# Patient Record
Sex: Female | Born: 1960 | Race: White | Hispanic: No | Marital: Married | Smoking: Never smoker
Health system: Southern US, Community
[De-identification: ages and names within clinical notes are randomized; demographics above are authoritative.]

## PROBLEM LIST (undated history)

## (undated) DIAGNOSIS — O24419 Gestational diabetes mellitus in pregnancy, unspecified control: Secondary | ICD-10-CM

## (undated) DIAGNOSIS — K219 Gastro-esophageal reflux disease without esophagitis: Secondary | ICD-10-CM

## (undated) DIAGNOSIS — B019 Varicella without complication: Secondary | ICD-10-CM

## (undated) DIAGNOSIS — Z87440 Personal history of urinary (tract) infections: Secondary | ICD-10-CM

## (undated) DIAGNOSIS — T7840XA Allergy, unspecified, initial encounter: Secondary | ICD-10-CM

## (undated) DIAGNOSIS — E039 Hypothyroidism, unspecified: Secondary | ICD-10-CM

## (undated) HISTORY — DX: Personal history of urinary (tract) infections: Z87.440

## (undated) HISTORY — DX: Gestational diabetes mellitus in pregnancy, unspecified control: O24.419

## (undated) HISTORY — DX: Allergy, unspecified, initial encounter: T78.40XA

## (undated) HISTORY — DX: Varicella without complication: B01.9

## (undated) HISTORY — DX: Gastro-esophageal reflux disease without esophagitis: K21.9

## (undated) HISTORY — DX: Hypothyroidism, unspecified: E03.9

## (undated) HISTORY — PX: WISDOM TOOTH EXTRACTION: SHX21

---

## 1996-05-16 HISTORY — PX: EXCISION VAGINAL CYST: SHX5825

## 1997-05-16 DIAGNOSIS — N200 Calculus of kidney: Secondary | ICD-10-CM

## 1997-05-16 HISTORY — PX: LITHOTRIPSY: SUR834

## 1997-05-16 HISTORY — DX: Calculus of kidney: N20.0

## 2005-04-25 ENCOUNTER — Other Ambulatory Visit: Admission: RE | Admit: 2005-04-25 | Discharge: 2005-04-25 | Payer: Self-pay | Admitting: Family Medicine

## 2006-08-16 ENCOUNTER — Other Ambulatory Visit: Admission: RE | Admit: 2006-08-16 | Discharge: 2006-08-16 | Payer: Self-pay | Admitting: Family Medicine

## 2009-09-02 ENCOUNTER — Encounter: Admission: RE | Admit: 2009-09-02 | Discharge: 2009-09-02 | Payer: Self-pay | Admitting: Family Medicine

## 2013-05-10 ENCOUNTER — Other Ambulatory Visit: Payer: Self-pay | Admitting: Family Medicine

## 2013-05-10 DIAGNOSIS — Z1231 Encounter for screening mammogram for malignant neoplasm of breast: Secondary | ICD-10-CM

## 2013-05-31 ENCOUNTER — Ambulatory Visit: Payer: Self-pay

## 2013-06-10 ENCOUNTER — Ambulatory Visit
Admission: RE | Admit: 2013-06-10 | Discharge: 2013-06-10 | Disposition: A | Discharge status: Home / Self Care | Payer: BC Managed Care – PPO | Source: Ambulatory Visit | Attending: Family Medicine | Admitting: Family Medicine

## 2013-06-10 DIAGNOSIS — Z1231 Encounter for screening mammogram for malignant neoplasm of breast: Secondary | ICD-10-CM

## 2016-03-07 LAB — FECAL OCCULT BLOOD, GUAIAC: Fecal Occult Blood: NEGATIVE

## 2017-08-01 ENCOUNTER — Other Ambulatory Visit: Payer: Self-pay | Admitting: Family Medicine

## 2017-08-01 DIAGNOSIS — Z139 Encounter for screening, unspecified: Secondary | ICD-10-CM

## 2017-09-04 ENCOUNTER — Encounter: Payer: Self-pay | Admitting: Radiology

## 2017-09-04 ENCOUNTER — Ambulatory Visit
Admission: RE | Admit: 2017-09-04 | Discharge: 2017-09-04 | Disposition: A | Discharge status: Home / Self Care | Payer: BC Managed Care – PPO | Source: Ambulatory Visit | Attending: Family Medicine | Admitting: Family Medicine

## 2017-09-04 DIAGNOSIS — Z139 Encounter for screening, unspecified: Secondary | ICD-10-CM

## 2017-09-04 LAB — HM MAMMOGRAPHY

## 2017-09-06 LAB — LIPID PANEL
Cholesterol: 197 (ref 0–200)
HDL: 49 (ref 35–70)
LDL Cholesterol: 127
Triglycerides: 108 (ref 40–160)

## 2017-09-06 LAB — BASIC METABOLIC PANEL
BUN: 10 (ref 4–21)
Creatinine: 0.7 (ref 0.5–1.1)
Potassium: 3.9 (ref 3.4–5.3)
Sodium: 140 (ref 137–147)

## 2017-09-06 LAB — TSH: TSH: 2.25 (ref 0.41–5.90)

## 2017-09-06 LAB — HM PAP SMEAR

## 2017-11-27 LAB — HM COLONOSCOPY

## 2018-12-20 ENCOUNTER — Other Ambulatory Visit: Payer: Self-pay

## 2018-12-20 DIAGNOSIS — Z20822 Contact with and (suspected) exposure to covid-19: Secondary | ICD-10-CM

## 2018-12-22 LAB — NOVEL CORONAVIRUS, NAA: SARS-CoV-2, NAA: NOT DETECTED

## 2019-03-18 ENCOUNTER — Other Ambulatory Visit: Payer: Self-pay

## 2019-03-18 DIAGNOSIS — Z20822 Contact with and (suspected) exposure to covid-19: Secondary | ICD-10-CM

## 2019-03-19 LAB — NOVEL CORONAVIRUS, NAA: SARS-CoV-2, NAA: NOT DETECTED

## 2019-06-03 ENCOUNTER — Ambulatory Visit (INDEPENDENT_AMBULATORY_CARE_PROVIDER_SITE_OTHER): Payer: BC Managed Care – PPO | Admitting: Family Medicine

## 2019-06-03 ENCOUNTER — Encounter: Payer: Self-pay | Admitting: Family Medicine

## 2019-06-03 ENCOUNTER — Other Ambulatory Visit: Payer: Self-pay

## 2019-06-03 VITALS — BP 142/82 | HR 72 | Temp 98.0°F | Resp 16 | Ht 61.0 in | Wt 188.2 lb

## 2019-06-03 DIAGNOSIS — R030 Elevated blood-pressure reading, without diagnosis of hypertension: Secondary | ICD-10-CM

## 2019-06-03 DIAGNOSIS — E039 Hypothyroidism, unspecified: Secondary | ICD-10-CM

## 2019-06-03 DIAGNOSIS — E669 Obesity, unspecified: Secondary | ICD-10-CM | POA: Diagnosis not present

## 2019-06-03 DIAGNOSIS — Z0000 Encounter for general adult medical examination without abnormal findings: Secondary | ICD-10-CM

## 2019-06-03 DIAGNOSIS — Z780 Asymptomatic menopausal state: Secondary | ICD-10-CM | POA: Insufficient documentation

## 2019-06-03 NOTE — Patient Instructions (Addendum)
Great to meet you today! We will check your thyroid levels today and call you with results.  Watch your blood pressure.  Low sodium diet and > 150 minutes per week exercise.  As soon as we get your records we will get you scheduled for physical and get the rest of your labs.     COVID vaccines are starting for the community.  1. Health Department: Appointments are required .Once your age bracket is called (watch news and visit website below for info) appt can be made by calling [phone] and selecting option 2.  Walk-ins will not be accepted.                  Clinic locations are: [facility], [address] [place]; [facility] [address], [address] [place]; . [facility] at [facility], [address], [address], [place]. Visit [email or web address] and click on the "COVID-19 Vaccine Info" rectangle for more information about vaccinations.  2. [place] web page will also have up to date info on vaccinations offered through [facility] System at  BornMarketers.[email or web address]. - Appt also required.  - vaccines are administered at the [employer] ([facility] women[facility] hosp). . If website indicates you are eligible for vaccine- please call [phone]   3. [facility]  -This will be done according to the rollout plan published by the [facility] of Health and [employer] (NCDHHS). The first public phase, also known as "Phase 1(b)", is for individuals 75 and older. Location: [facility] (Health and Chief Strategy Officer) [address]; [place] [address] -  All citizens receiving a vaccine must complete a COVID-19 consent form. To lessen your wait time, these can be found and completed ahead of time at www.[email or web address] or you may pick up a paper copy at the [facility] [phone] in [place]) or at [employer] - Future dates and locations will be widely shared through the [facility] Division of Public Health's COVID-19 Hotline at [phone], media outlets in the [place] and through the [employer] ([email or web address] and [email or web address]). Citizens can also contact the Department of Health and Human Services at [phone] for more information   Participants are asked to wear a face covering at vaccination sites. We are committed to keeping you informed about the COVID-19 vaccine.  As the vaccine continues to become available for each phase, we will ensure that patients who meet the criteria receive the information they need to access vaccination opportunities. Continue to check your MyChart account and [email or web address] for updates. Please review the Phase 1b information below.  Following [place]'s guidelines for the distribution of COVID-19 vaccines we are pleased to share our plans to begin offering vaccines to those 65 and older (Phase 1b). Here are details of those plans:  [facility] COVID-19 Vaccination Clinic - [place] On Tuesday, Jan. 19, the [facility] Division of Public Health [facility]) and [place] begin large-scale COVID-19 vaccinations at the [facility]. The vaccinations are appointment only and for those 65 and older.  Walk-ins will not be accepted.  All appointments are currently filled. Please join our waiting list for the next available appointments. We will contact you when appointments become available. Please do not sign up more than once.  Join Our Waiting List   Other Vaccination Opportunities in Our Area We are also working in  partnership with county health agencies in our service counties to ensure continuing vaccination availability in the weeks and months ahead. Learn more about each county's vaccination  efforts in the website links below:   [place]'s phase 1b vaccination guidelines, prioritizing those 65 and over as the next eligible group to receive the COVID-19 vaccine, are detailed at MobCommunity.ch.   Vaccine Safety and Effectiveness Clinical trials for the Pfizer COVID-19 vaccine involved 42,000 people and showed that the vaccine is more than 95% effective in preventing COVID-19 with no serious safety concerns. Similar results have been reported for the Moderna COVID-19 vaccine. Side effects reported in the [place] clinical trials include a sore arm at the injection site, fatigue, headache, chills and fever. While side effects from the Gardiner COVID-19 vaccine are higher than for a typical flu vaccine, they are lower in many ways than side effects from the leading vaccine to prevent shingles. Side effects are signs that a vaccine is working and are related to your immune system being stimulated to produce antibodies against infection. Side effects from vaccination are far less significant than health impacts from COVID-19.  Staying Informed Pharmacists, infectious disease doctors, critical care nurses and other experts at [facility] continue to speak publicly through media interviews and direct communication with our patients and communities about the safety, effectiveness and importance of vaccines to eliminate COVID-19. In addition, reliable information on vaccine safety, effectiveness, side effects and more is available on the following websites:  [place] Department of Health and Human Services COVID-19 Vaccine Information Website.  U.S. Centers for Disease Control and Prevention CVELF-81 Human resources officer.  Staying Safe We agree with the CDC on what we can do to help our communities get back to normal: Getting "back to normal" is going to take all of our tools. If we use all the tools we have, we  stand the best chance of getting our families, communities, schools and workplaces "back to normal" sooner:  Get vaccinated as soon as vaccines become available within the phase of the state's vaccination rollout plan for which you meet the eligibility criteria.  Wear a mask.  Stay 6 feet from others and avoid crowds.  Wash hands often.  For our most current information, please visit [email or web address].  Please help [place] help you:  We are honored you have chosen [place] for your Primary Care home. Below you will find basic instructions that you may need to access in the future. Please help [place] help you by reading the instructions, which cover many of the frequent questions we experience.   Prescription refills and request:  -In order to allow more efficient response time, please call your pharmacy for all refills. They will forward the request electronically to [place]. This allows for the quickest possible response. Request left on a nurse line can take longer to refill, since these are checked as time allows between office patients and other phone calls.  - refill request can take up to 3-5 working days to complete.  - If request is sent electronically and request is appropiate, it is usually completed in 1-2 business days.  - all patients will need to be seen routinely for all chronic medical conditions requiring prescription medications (see follow-up below). If you are overdue for follow up on your condition, you will be asked to make an appointment and we will call in enough medication to cover you until your appointment (up  to 30 days).  - all controlled substances will require a face to face visit to request/refill.  - if you desire your prescriptions to go through a new pharmacy, and have an active script at original pharmacy, you will need to call your pharmacy and have scripts transferred to new pharmacy. This is completed between the pharmacy locations and not by your  provider.    Results: If any images or labs were ordered, it can take up to 1 week to get results depending on the test ordered and the lab/facility running and resulting the test. - Normal or stable results, which do not need further discussion, may be released to your mychart immediately with attached note to you. A call may not be generated for normal results. Please make certain to sign up for mychart. If you have questions on how to activate your mychart you can call the front office.  - If your results need further discussion, our office will attempt to contact you via phone, and if unable to reach you after 2 attempts, we will release your abnormal result to your mychart with instructions.  - All results will be automatically released in mychart after 1 week.  - Your provider will provide you with explanation and instruction on all relevant material in your results. Please keep in mind, results and labs may appear confusing or abnormal to the untrained eye, but it does not mean they are actually abnormal for you personally. If you have any questions about your results that are not covered, or you desire more detailed explanation than what was provided, you should make an appointment with your provider to do so.   Our office handles many outgoing and incoming calls daily. If we have not contacted you within 1 week about your results, please check your mychart to see if there is a message first and if not, then contact our office.  In helping with this matter, you help decrease call volume, and therefore allow [place] to be able to respond to patients needs more efficiently.   Acute office visits (sick visit):  An acute visit is intended for a new problem and are scheduled in shorter time slots to allow schedule openings for patients with new problems. This is the appropriate visit to discuss a new problem. Problems will not be addressed by phone call or Echart message. Appointment is needed if  requesting treatment. In order to provide you with excellent quality medical care with proper time for you to explain your problem, have an exam and receive treatment with instructions, these appointments should be limited to one new problem per visit. If you experience a new problem, in which you desire to be addressed, please make an acute office visit, we save openings on the schedule to accommodate you. Please do not save your new problem for any other type of visit, let us take care of it properly and quickly for you.   Follow up visits:  Depending on your condition(s) your provider will need to see you routinely in order to provide you with quality care and prescribe medication(s). Most chronic conditions (Example: hypertension, Diabetes, depression/anxiety... etc), require visits a couple times a year. Your provider will instruct you on proper follow up for your personal medical conditions and history. Please make certain to make follow up appointments for your condition as instructed. Failing to do so could result in lapse in your medication treatment/refills. If you request a refill, and are overdue to be seen on a  condition, we will always provide you with a 30 day script (once) to allow you time to schedule.    Medicare wellness (well visit): - we have a wonderful Nurse Selena Batten), that will meet with you and provide you will yearly medicare wellness visits. These visits should occur yearly (can not be scheduled less than 1 calendar year apart) and cover preventive health, immunizations, advance directives and screenings you are entitled to yearly through your medicare benefits. Do not miss out on your entitled benefits, this is when medicare will pay for these benefits to be ordered for you.  These are strongly encouraged by your provider and is the appropriate type of visit to make certain you are up to date with all preventive health benefits. If you have not had your medicare wellness exam in the  last 12 months, please make certain to schedule one by calling the office and schedule your medicare wellness with Selena Batten as soon as possible.   Yearly physical (well visit):  - Adults are recommended to be seen yearly for physicals. Check with your insurance and date of your last physical, most insurances require one calendar year between physicals. Physicals include all preventive health topics, screenings, medical exam and labs that are appropriate for gender/age and history. You may have fasting labs needed at this visit. This is a well visit (not a sick visit), new problems should not be covered during this visit (see acute visit).  - Pediatric patients are seen more frequently when they are younger. Your provider will advise you on well child visit timing that is appropriate for your their age. - This is not a medicare wellness visit. Medicare wellness exams do not have an exam portion to the visit. Some medicare companies allow for a physical, some do not allow a yearly physical. If your medicare allows a yearly physical you can schedule the medicare wellness with our nurse Selena Batten and have your physical with your provider after, on the same day. Please check with insurance for your full benefits.   Late Policy/No Shows:  - all new patients should arrive 15-30 minutes earlier than appointment to allow [place] time  to  obtain all personal demographics,  insurance information and for you to complete office paperwork. - All established patients should arrive 10-15 minutes earlier than appointment time to update all information and be checked in .  - In our best efforts to run on time, if you are late for your appointment you will be asked to either reschedule or if able, we will work you back into the schedule. There will be a wait time to work you back in the schedule,  depending on availability.  - If you are unable to make it to your appointment as scheduled, please call 24 hours ahead of time to allow [place] to  fill the time slot with someone else who needs to be seen. If you do not cancel your appointment ahead of time, you may be charged a no show fee.

## 2019-06-03 NOTE — Progress Notes (Signed)
Patient ID: Kristy Allen, female  DOB: 08-17-1960, 59 y.o.   MRN: [id number] Patient Care Team    Relationship Specialty Notifications Start End  Ma Hillock, DO PCP - General Family Medicine  06/03/19   Nat Christen, MD Attending Physician Optometry  06/03/19   Laurence Spates, MD Consulting Physician Gastroenterology  06/03/19     Chief Complaint  Patient presents with  . Establish Care    Prior PCP [facility] [place]. Pt needs thyroid checked and medication refils.     Subjective:  Kristy Allen is a 60 y.o.  female present for new patient establishment. All past medical history, surgical history, allergies, family history, immunizations, medications and social history were udpated in the electronic medical record today. All recent labs, ED visits and hospitalizations within the last year were reviewed.  Hypothyroidism: Patient reports she has a history of hypothyroidism going back as long as approximately 16-18 years.  She has been stable on levothyroxine 100 mcg daily for approximately the last 5 years.  She denies any worsening hypothyroid symptoms such as constipation, depression, fatigue.  She reports the last time her thyroid checked was this time last year.  She does require refills today.   Depression screen [facility] 2/9 06/03/2019  Decreased Interest 0  Down, Depressed, Hopeless 0  PHQ - 2 Score 0   No flowsheet data found.     No flowsheet data found.   Immunization History  Administered Date(s) Administered  . Influenza,inj,Quad PF,6+ Mos 01/21/2019, 02/14/2019  . Tdap 05/16/2017  . Zoster Recombinat (Shingrix) 01/21/2019, 03/23/2019    No exam data present  Past Medical History:  Diagnosis Date  . Allergy   . Chicken pox   . GERD (gastroesophageal reflux disease)   . Gestational diabetes   . History of UTI   . Hypothyroidism   . Nephrolithiasis 1999   Allergies  Allergen Reactions  . Sulfa Antibiotics Rash   Past Surgical History:  Procedure  Laterality Date  . CESAREAN SECTION  1986  . EXCISION VAGINAL CYST  1998  . LITHOTRIPSY  1999  . WISDOM TOOTH EXTRACTION     Family History  Problem Relation Age of Onset  . Pulmonary fibrosis Mother        Mother also had a benign brain tumor (2000)  . Hypertension Mother   . Hypertension Father   . Hypothyroidism Father   . AAA (abdominal aortic aneurysm) Father   . Diabetes Sister   . Anxiety disorder Sister   . Hypertension Sister   . Miscarriages / Stillbirths Sister   . Asthma Daughter   . Asthma Son   . Hypertension Son   . Aortic aneurysm Son        With repair 4 years ago.  [facility] Heart attack Maternal Grandmother   . Hypertension Maternal Grandmother   . Hypertension Maternal Grandfather   . Heart attack Maternal Grandfather   . Diabetes Maternal Grandfather   . Hypertension Paternal Grandmother   . Heart attack Paternal Grandmother   . Hypertension Paternal Grandfather    Social History   Social History Narrative   Marital status/children/pets: Married.  3 children.   Education/employment: 16+ years of education.  Employed as a Energy manager.   Safety:      -smoke alarm in the home:Yes     - wears seatbelt: Yes     - Feels safe in their relationships: Yes    Allergies as of 06/03/2019  Reactions   Sulfa Antibiotics Rash      Medication List       Accurate as of June 03, 2019  6:20 PM. If you have any questions, ask your nurse or doctor.        fluticasone 50 MCG/ACT nasal spray Commonly known as: FLONASE Place into both nostrils daily.   levothyroxine 100 MCG tablet Commonly known as: SYNTHROID Take 100 mcg by mouth daily before breakfast.   loratadine 10 MG tablet Commonly known as: CLARITIN Take 10 mg by mouth daily.   MULTIVITAMIN ADULT PO Take by mouth.       All past medical history, surgical history, allergies, family history, immunizations andmedications were updated in the EMR today and reviewed under the  history and medication portions of their EMR.     ROS: 14 pt review of systems performed and negative (unless mentioned in an HPI)  Objective: BP (!) 142/82 (BP Location: Right Arm, Patient Position: Sitting, Cuff Size: Normal)   Pulse 72   Temp 98 F (36.7 C) (Temporal)   Resp 16   Ht 5\' 1"  (1.549 m)   Wt 188 lb 4 oz (85.4 kg)   SpO2 100%   BMI 35.57 kg/m  Gen: Afebrile. No acute distress. Nontoxic in appearance, well-developed, well-nourished, pleasant, obese Caucasian female. HENT: AT. [place].  Eyes:Pupils Equal Round Reactive to light, Extraocular movements intact,  Conjunctiva without redness, discharge or icterus. Neck/lymp/endocrine: Supple, no lymphadenopathy, no thyromegaly CV: RRR no murmur, no edema  Chest: CTAB, no wheeze, rhonchi or crackles. Neuro/Msk: Alert. Oriented x3.  Psych: Normal affect, dress and demeanor. Normal speech. Normal thought content and judgment.   Assessment/plan: Kristy Allen is a 59 y.o. female present for est care.  Hypothyroidism, unspecified type We will collect labs today.  Refills will be provided on levothyroxine 100 mcg daily or alternative dose if TSH levels indicate need once lab results received. - TSH - T4, free -If normal, follow yearly  Elevated BP without diagnosis of hypertension/obesity Discussion today surrounding her elevated blood pressure which was mildly improved but still elevated on repeat today.  She has gained some weight over the pandemic.  She used to work out frequently and has not done so lately.  She has not been told in the past her blood pressure is elevated and she believes this is the first time her blood pressure has been elevated. Discussed low-sodium diet. Greater than 150 minutes a week of routine exercise. We will await her records and get her back for close follow-up and/or CPE to recheck blood pressure.   No follow-ups on file. Orders Placed This Encounter  Procedures  . TSH  . T4, free   No orders of  the defined types were placed in this encounter.    Note is dictated utilizing voice recognition software. Although note has been proof read prior to signing, occasional typographical errors still can be missed. If any questions arise, please do not hesitate to call for verification.  Electronically signed by: 41, DO [facility] [place]

## 2019-06-04 ENCOUNTER — Telehealth: Payer: Self-pay | Admitting: Family Medicine

## 2019-06-04 LAB — T4, FREE: Free T4: 0.63 ng/dL (ref 0.60–1.60)

## 2019-06-04 LAB — TSH: TSH: 10.12 u[IU]/mL — ABNORMAL HIGH (ref 0.35–4.50)

## 2019-06-04 MED ORDER — LEVOTHYROXINE SODIUM 112 MCG PO TABS: 112.0000 ug | ORAL_TABLET | Freq: Every day | ORAL | 0 refills | Status: DC

## 2019-06-04 NOTE — Telephone Encounter (Signed)
Pt was called and given lab results, she verbalized understanding. CPE was scheduled.

## 2019-06-04 NOTE — Telephone Encounter (Signed)
Patient was seen yesterday for new patient establishment and was in need of having her thyroid levels checked due to her hyperthyroidism.  She reported taking levothyroxine 100 mcg daily.  Her TSH level is rather significantly high at 10.  We will need to increase her dose and see her back in 8 weeks for recheck.  I have called the higher dose levothyroxine 112 mcg for her to take daily on an empty stomach.  Please schedule her for follow-up in 8 weeks, and this can be her physical-and we will collect her other routine labs and recheck her thyroid levels.

## 2019-06-13 ENCOUNTER — Encounter: Payer: Self-pay | Admitting: Family Medicine

## 2019-06-21 ENCOUNTER — Telehealth: Payer: Self-pay | Admitting: Family Medicine

## 2019-06-21 NOTE — Telephone Encounter (Signed)
Please call patient and inform her I have received and reviewed her records.   We told her we would call her back once we receive the records and know if she needed a physical.  Please schedule her for mid March for her CPE and thyroid follow-up.  She is due at that time to follow-up on her abnormal thyroid levels from her established appointment on 06/04/2019 we can complete both of these at the same appointment.

## 2019-06-24 NOTE — Telephone Encounter (Signed)
Pt was called and given detailed information on her VM and asked to call back to schedule CPE for mid March

## 2019-07-13 ENCOUNTER — Ambulatory Visit: Payer: BC Managed Care – PPO | Attending: Internal Medicine

## 2019-07-13 DIAGNOSIS — Z23 Encounter for immunization: Secondary | ICD-10-CM | POA: Insufficient documentation

## 2019-07-13 NOTE — Progress Notes (Signed)
   Covid-19 Vaccination Clinic  Name:  Hokulani Rogel    MRN: [id number] DOB: November 04, 1960  07/13/2019  Ms. Funari was observed post Covid-19 immunization for 15 minutes without incidence. She was provided with Vaccine Information Sheet and instruction to access the V-Safe system.   Ms. Brandt was instructed to call 911 with any severe reactions post vaccine: [facility] Kitchen Difficulty breathing  . Swelling of your face and throat  . A fast heartbeat  . A bad rash all over your body  . Dizziness and weakness    Immunizations Administered    Name Date Dose VIS Date Route   Pfizer COVID-19 Vaccine 07/13/2019 10:26 AM 0.3 mL 04/26/2019 Intramuscular   Manufacturer: [employer], [employer]   Lot: [id number]   NDC: [address]-0

## 2019-07-29 ENCOUNTER — Ambulatory Visit: Payer: BC Managed Care – PPO

## 2019-07-30 ENCOUNTER — Ambulatory Visit: Payer: BC Managed Care – PPO | Attending: Internal Medicine

## 2019-07-30 DIAGNOSIS — Z23 Encounter for immunization: Secondary | ICD-10-CM

## 2019-07-30 NOTE — Progress Notes (Signed)
   Covid-19 Vaccination Clinic  Name:  Kristy Allen    MRN: [id number] DOB: December 05, 1960  07/30/2019  [name] was observed post Covid-19 immunization for 15 minutes without incident. She was provided with Vaccine Information Sheet and instruction to access the V-Safe system.   [name] was instructed to call 911 with any severe reactions post vaccine: Marland Kitchen Difficulty breathing  . Swelling of face and throat  . A fast heartbeat  . A bad rash all over body  . Dizziness and weakness   Immunizations Administered    Name Date Dose VIS Date Route   Pfizer COVID-19 Vaccine 07/30/2019 12:40 PM 0.3 mL 04/26/2019 Intramuscular   Manufacturer: [employer], [employer]   Lot: [id number]   NDC: [address]-5

## 2019-08-07 ENCOUNTER — Encounter: Payer: BC Managed Care – PPO | Admitting: Family Medicine

## 2019-08-07 ENCOUNTER — Ambulatory Visit: Payer: BC Managed Care – PPO

## 2019-08-13 ENCOUNTER — Ambulatory Visit (INDEPENDENT_AMBULATORY_CARE_PROVIDER_SITE_OTHER): Payer: BC Managed Care – PPO | Admitting: Family Medicine

## 2019-08-13 ENCOUNTER — Other Ambulatory Visit: Payer: Self-pay

## 2019-08-13 ENCOUNTER — Encounter: Payer: Self-pay | Admitting: Family Medicine

## 2019-08-13 VITALS — BP 140/80 | HR 72 | Temp 97.7°F | Resp 18 | Ht 60.75 in | Wt 187.2 lb

## 2019-08-13 DIAGNOSIS — Z131 Encounter for screening for diabetes mellitus: Secondary | ICD-10-CM | POA: Diagnosis not present

## 2019-08-13 DIAGNOSIS — E669 Obesity, unspecified: Secondary | ICD-10-CM

## 2019-08-13 DIAGNOSIS — Z1159 Encounter for screening for other viral diseases: Secondary | ICD-10-CM | POA: Diagnosis not present

## 2019-08-13 DIAGNOSIS — R0681 Apnea, not elsewhere classified: Secondary | ICD-10-CM | POA: Diagnosis not present

## 2019-08-13 DIAGNOSIS — Z1231 Encounter for screening mammogram for malignant neoplasm of breast: Secondary | ICD-10-CM | POA: Diagnosis not present

## 2019-08-13 DIAGNOSIS — E039 Hypothyroidism, unspecified: Secondary | ICD-10-CM

## 2019-08-13 DIAGNOSIS — Z0000 Encounter for general adult medical examination without abnormal findings: Secondary | ICD-10-CM | POA: Diagnosis not present

## 2019-08-13 DIAGNOSIS — I10 Essential (primary) hypertension: Secondary | ICD-10-CM | POA: Insufficient documentation

## 2019-08-13 LAB — COMPREHENSIVE METABOLIC PANEL
ALT: 21 U/L (ref 0–35)
AST: 15 U/L (ref 0–37)
Albumin: 4.1 g/dL (ref 3.5–5.2)
Alkaline Phosphatase: 89 U/L (ref 39–117)
BUN: 17 mg/dL (ref 6–23)
CO2: 28 mEq/L (ref 19–32)
Calcium: 9.3 mg/dL (ref 8.4–10.5)
Chloride: 104 mEq/L (ref 96–112)
Creatinine, Ser: 0.71 mg/dL (ref 0.40–1.20)
GFR: 84.32 mL/min (ref >60.00)
Glucose, Bld: 97 mg/dL (ref 70–99)
Potassium: 4.4 mEq/L (ref 3.5–5.1)
Sodium: 138 mEq/L (ref 135–145)
Total Bilirubin: 0.5 mg/dL (ref 0.2–1.2)
Total Protein: 6.7 g/dL (ref 6.0–8.3)

## 2019-08-13 LAB — CBC
HCT: 39.9 % (ref 36.0–46.0)
Hemoglobin: 13.4 g/dL (ref 12.0–15.0)
MCHC: 33.5 g/dL (ref 30.0–36.0)
MCV: 85.5 fl (ref 78.0–100.0)
Platelets: 416 10*3/uL — ABNORMAL HIGH (ref 150.0–400.0)
RBC: 4.67 Mil/uL (ref 3.87–5.11)
RDW: 14.2 % (ref 11.5–15.5)
WBC: 5.8 10*3/uL (ref 4.0–10.5)

## 2019-08-13 LAB — LIPID PANEL
Cholesterol: 203 mg/dL — ABNORMAL HIGH (ref 0–200)
HDL: 51.1 mg/dL (ref >39.00)
LDL Cholesterol: 130 mg/dL — ABNORMAL HIGH (ref 0–99)
NonHDL: 151.7
Total CHOL/HDL Ratio: 4
Triglycerides: 107 mg/dL (ref 0.0–149.0)
VLDL: 21.4 mg/dL (ref 0.0–40.0)

## 2019-08-13 LAB — T4, FREE: Free T4: 1.31 ng/dL (ref 0.60–1.60)

## 2019-08-13 LAB — TSH: TSH: 0.04 u[IU]/mL — ABNORMAL LOW (ref 0.35–4.50)

## 2019-08-13 LAB — HEMOGLOBIN A1C: Hgb A1c MFr Bld: 5.8 % (ref 4.6–6.5)

## 2019-08-13 MED ORDER — HYDROCHLOROTHIAZIDE 25 MG PO TABS: 12.5000 mg | ORAL_TABLET | Freq: Every day | ORAL | 1 refills | Status: DC

## 2019-08-13 NOTE — Patient Instructions (Addendum)
Start HCTZ 1/2 tab daily. I have called this in for you.   Health Maintenance, Female Adopting a healthy lifestyle and getting preventive care are important in promoting health and wellness. Ask your health care provider about:  The right schedule for you to have regular tests and exams.  Things you can do on your own to prevent diseases and keep yourself healthy. What should I know about diet, weight, and exercise? Eat a healthy diet   Eat a diet that includes plenty of vegetables, fruits, low-fat dairy products, and lean protein.  Do not eat a lot of foods that are high in solid fats, added sugars, or sodium. Maintain a healthy weight Body mass index (BMI) is used to identify weight problems. It estimates body fat based on height and weight. Your health care provider can help determine your BMI and help you achieve or maintain a healthy weight. Get regular exercise Get regular exercise. This is one of the most important things you can do for your health. Most adults should:  Exercise for at least 150 minutes each week. The exercise should increase your heart rate and make you sweat (moderate-intensity exercise).  Do strengthening exercises at least twice a week. This is in addition to the moderate-intensity exercise.  Spend less time sitting. Even light physical activity can be beneficial. Watch cholesterol and blood lipids Have your blood tested for lipids and cholesterol at 59 years of age, then have this test every 5 years. Have your cholesterol levels checked more often if:  Your lipid or cholesterol levels are high.  You are older than 59 years of age.  You are at high risk for heart disease. What should I know about cancer screening? Depending on your health history and family history, you may need to have cancer screening at various ages. This may include screening for:  Breast cancer.  Cervical cancer.  Colorectal cancer.  Skin cancer.  Lung cancer. What  should I know about heart disease, diabetes, and high blood pressure? Blood pressure and heart disease  High blood pressure causes heart disease and increases the risk of stroke. This is more likely to develop in people who have high blood pressure readings, are of African descent, or are overweight.  Have your blood pressure checked: ? Every 3-5 years if you are 75-18 years of age. ? Every year if you are 55 years old or older. Diabetes Have regular diabetes screenings. This checks your fasting blood sugar level. Have the screening done:  Once every three years after age 33 if you are at a normal weight and have a low risk for diabetes.  More often and at a younger age if you are overweight or have a high risk for diabetes. What should I know about preventing infection? Hepatitis B If you have a higher risk for hepatitis B, you should be screened for this virus. Talk with your health care provider to find out if you are at risk for hepatitis B infection. Hepatitis C Testing is recommended for:  Everyone born from [birth year] through 1965.  Anyone with known risk factors for hepatitis C. Sexually transmitted infections (STIs)  Get screened for STIs, including gonorrhea and chlamydia, if: ? You are sexually active and are younger than 59 years of age. ? You are older than 59 years of age and your health care provider tells you that you are at risk for this type of infection. ? Your sexual activity has changed since you were last screened, and you  are at increased risk for chlamydia or gonorrhea. Ask your health care provider if you are at risk.  Ask your health care provider about whether you are at high risk for HIV. Your health care provider may recommend a prescription medicine to help prevent HIV infection. If you choose to take medicine to prevent HIV, you should first get tested for HIV. You should then be tested every 3 months for as long as you are taking the medicine. Pregnancy  If  you are about to stop having your period (premenopausal) and you may become pregnant, seek counseling before you get pregnant.  Take 400 to 800 micrograms (mcg) of folic acid every day if you become pregnant.  Ask for birth control (contraception) if you want to prevent pregnancy. Osteoporosis and menopause Osteoporosis is a disease in which the bones lose minerals and strength with aging. This can result in bone fractures. If you are 20 years old or older, or if you are at risk for osteoporosis and fractures, ask your health care provider if you should:  Be screened for bone loss.  Take a calcium or vitamin D supplement to lower your risk of fractures.  Be given hormone replacement therapy (HRT) to treat symptoms of menopause. Follow these instructions at home: Lifestyle  Do not use any products that contain nicotine or tobacco, such as cigarettes, e-cigarettes, and chewing tobacco. If you need help quitting, ask your health care provider.  Do not use street drugs.  Do not share needles.  Ask your health care provider for help if you need support or information about quitting drugs. Alcohol use  Do not drink alcohol if: ? Your health care provider tells you not to drink. ? You are pregnant, may be pregnant, or are planning to become pregnant.  If you drink alcohol: ? Limit how much you use to 0-1 drink a day. ? Limit intake if you are breastfeeding.  Be aware of how much alcohol is in your drink. In the [place] one drink equals one 12 oz bottle of beer (355 mL), one 5 oz glass of wine (148 mL), or one 1 oz glass of hard liquor (44 mL). General instructions  Schedule regular health, dental, and eye exams.  Stay current with your vaccines.  Tell your health care provider if: ? You often feel depressed. ? You have ever been abused or do not feel safe at home. Summary  Adopting a healthy lifestyle and getting preventive care are important in promoting health and  wellness.  Follow your health care provider's instructions about healthy diet, exercising, and getting tested or screened for diseases.  Follow your health care provider's instructions on monitoring your cholesterol and blood pressure. This information is not intended to replace advice given to you by your health care provider. Make sure you discuss any questions you have with your health care provider. Document Revised: 04/25/2018 Document Reviewed: 04/25/2018 Elsevier Patient Education  2020 [employer].

## 2019-08-13 NOTE — Progress Notes (Signed)
This visit occurred during the SARS-CoV-2 public health emergency.  Safety protocols were in place, including screening questions prior to the visit, additional usage of staff PPE, and extensive cleaning of exam room while observing appropriate contact time as indicated for disinfecting solutions.    Patient ID: Kristy Allen, female  DOB: 1960-11-07, 59 y.o.   MRN: [id number] Patient Care Team    Relationship Specialty Notifications Start End  Ma Hillock, DO PCP - General Family Medicine  06/03/19   Nat Christen, MD Attending Physician Optometry  06/03/19   Laurence Spates, MD (Inactive) Consulting Physician Gastroenterology  06/03/19     Chief Complaint  Patient presents with  . Annual Exam    Last Pap and Mammo 2019. Fasting.    Subjective: Kristy Allen is a 59 y.o.  Female  present for CPE . All past medical history, surgical history, allergies, family history, immunizations, medications and social history were updated in the electronic medical record today. All recent labs, ED visits and hospitalizations within the last year were reviewed.  Health maintenance:  Colonoscopy: completed 7/ 2019 per pt, by [facility]> requested records. Pt reports normal.  Mammogram: completed: 09/04/2017, [facility].   Cervical cancer screening: last pap: 4/2019Neg w/ neg hpv> 3-5 yr. By prior pcp.  Immunizations: tdap UTD 05/2017, Influenza UTD 2020 (encouraged yearly), shingrix series completed, covid series completed Infectious disease screening: HIV declined today, Hep C completed today.  DEXA: per routine screen Assistive device: none Oxygen WNI:OEVO Patient has a Dental home. Hospitalizations/ED visits: reviewed  Essential hypertension/obesity Patient has elevated blood pressures over the last few visits.  Blood pressures ranges at home are not routinely checked. Patient denies chest pain, shortness of breath or lower extremity edema. RF: Obesity, family history of heart disease  Witnessed  apneic spells: Patient reports her husband has noted she had apneic spells during her sleep.  She has been worked up recently for fatigue and found to have an elevated TSH.  Her medication regimen was altered, in which she feels like she has more energy and less fatigued but is concerned she may have sleep apnea.  Hypothyroidism: Patient reports she has a history of hypothyroidism going back as long as approximately 16-18 years.    Patient reports she feels improved after the increase in her levothyroxine from 100 mcg to 112 mcg last visit secondary to elevated TSH.  Depression screen [facility] 2/9 08/13/2019 06/03/2019  Decreased Interest 0 0  Down, Depressed, Hopeless 0 0  PHQ - 2 Score 0 0   No flowsheet data found.   Immunization History  Administered Date(s) Administered  . Influenza,inj,Quad PF,6+ Mos 01/21/2019, 02/14/2019  . PFIZER SARS-COV-2 Vaccination 07/13/2019, 07/30/2019  . Tdap 05/16/2017  . Zoster Recombinat (Shingrix) 01/21/2019, 03/23/2019     Past Medical History:  Diagnosis Date  . Allergy   . Chicken pox   . GERD (gastroesophageal reflux disease)   . Gestational diabetes   . History of UTI   . Hypothyroidism   . Nephrolithiasis 1999   Allergies  Allergen Reactions  . Sulfa Antibiotics Rash   Past Surgical History:  Procedure Laterality Date  . CESAREAN SECTION  1986  . EXCISION VAGINAL CYST  1998  . LITHOTRIPSY  1999  . WISDOM TOOTH EXTRACTION     Family History  Problem Relation Age of Onset  . Pulmonary fibrosis Mother        Mother also had a benign brain tumor (2000)  . Hypertension Mother   .  Hypertension Father   . Hypothyroidism Father   . AAA (abdominal aortic aneurysm) Father   . Diabetes Sister   . Anxiety disorder Sister   . Hypertension Sister   . Miscarriages / Stillbirths Sister   . Asthma Daughter   . Asthma Son   . Hypertension Son   . Aortic aneurysm Son        With repair 4 years ago.  Marland Kitchen Heart attack Maternal Grandmother   .  Hypertension Maternal Grandmother   . Hypertension Maternal Grandfather   . Heart attack Maternal Grandfather   . Diabetes Maternal Grandfather   . Hypertension Paternal Grandmother   . Heart attack Paternal Grandmother   . Hypertension Paternal Grandfather    Social History   Social History Narrative   Marital status/children/pets: Married.  3 children.   Education/employment: 16+ years of education.  Employed as a Ecologist.   Safety:      -smoke alarm in the home:Yes     - wears seatbelt: Yes     - Feels safe in their relationships: Yes    Allergies as of 08/13/2019      Reactions   Sulfa Antibiotics Rash      Medication List       Accurate as of August 13, 2019  9:34 AM. If you have any questions, ask your nurse or doctor.        fluticasone 50 MCG/ACT nasal spray Commonly known as: FLONASE Place into both nostrils daily.   hydrochlorothiazide 25 MG tablet Commonly known as: HYDRODIURIL Take 0.5 tablets (12.5 mg total) by mouth daily. Started by: Felix Pacini, DO   levothyroxine 112 MCG tablet Commonly known as: SYNTHROID Take 1 tablet (112 mcg total) by mouth daily before breakfast.   loratadine 10 MG tablet Commonly known as: CLARITIN Take 10 mg by mouth daily.   MULTIVITAMIN ADULT PO Take by mouth.       All past medical history, surgical history, allergies, family history, immunizations andmedications were updated in the EMR today and reviewed under the history and medication portions of their EMR.      MM SCREENING BREAST TOMO BILATERAL  Result Date: 09/04/2017 CLINICAL DATA:  Screening. EXAM: DIGITAL SCREENING BILATERAL MAMMOGRAM WITH TOMO AND CAD COMPARISON:  Previous exam(s). ACR Breast Density Category c: The breast tissue is heterogeneously dense, which may obscure small masses. FINDINGS: There are no findings suspicious for malignancy. Images were processed with CAD. IMPRESSION: No mammographic evidence of malignancy. A  result letter of this screening mammogram will be mailed directly to the patient. RECOMMENDATION: Screening mammogram in one year. (Code:SM-B-01Y) BI-RADS CATEGORY  1: Negative. Electronically Signed   By: Frederico Hamman M.D.   On: 09/04/2017 15:04     ROS: 14 pt review of systems performed and negative (unless mentioned in an HPI)  Objective: BP 140/80 (BP Location: Left Arm, Patient Position: Sitting, Cuff Size: Normal)   Pulse 72   Temp 97.7 F (36.5 C) (Temporal)   Resp 18   Ht 5' 0.75" (1.543 m)   Wt 187 lb 4 oz (84.9 kg)   SpO2 99%   BMI 35.67 kg/m  Gen: Afebrile. No acute distress. Nontoxic in appearance, well-developed, well-nourished,  Obese, female.  HENT: AT. [place]. Bilateral TM visualized and normal in appearance, normal external auditory canal. MMM, no oral lesions, adequate dentition. Bilateral nares within normal limits. Throat without erythema, ulcerations or exudates. no Cough on exam, no hoarseness on exam. Eyes:Pupils Equal Round Reactive to  light, Extraocular movements intact,  Conjunctiva without redness, discharge or icterus. Neck/lymp/endocrine: Supple,no lymphadenopathy, no thyromegaly CV: RRR no murmur, no edema, +2/4 P posterior tibialis pulses. no carotid bruits. No JVD. Chest: CTAB, no wheeze, rhonchi or crackles. normal Respiratory effort. good Air movement. Abd: Soft. obese. NTND. BS present. no Masses palpated. No hepatosplenomegaly. No rebound tenderness or guarding. Skin: no rashes, purpura or petechiae. Warm and well-perfused. Skin intact. Neuro/Msk:  Normal gait. PERLA. EOMi. Alert. Oriented x3.  Cranial nerves II through XII intact. Muscle strength 5/5 upper/lower extremity. DTRs equal bilaterally. Psych: Normal affect, dress and demeanor. Normal speech. Normal thought content and judgment.   No exam data present  Assessment/plan: Kristy Allen is a 60 y.o. female present for CPE and new conditions Acquired hypothyroidism We will continue/prescribe  thyroid replacement at appropriate dose once results received. - Comprehensive metabolic panel - TSH - T4, free  Essential hypertension/obesity Discussed start of medication with patient today and she is agreeable. Start HCTZ 12.5 mg daily in the morning. -Low-sodium diet -Increase exercise regimen - CBC - Comprehensive metabolic panel - Lipid panel Follow-up 5.5 months  Diabetes mellitus screening - Hemoglobin A1c  Need for hepatitis C screening test Pt agreeable to testing today - Hepatitis C Antibody  Encounter for screening mammogram for malignant neoplasm of breast - MM 3D SCREEN BREAST BILATERAL; Future  Witnessed apneic spells Discussed her fatigue and witnessed apneic spells and agree she could benefit from sleep apnea testing.  Body mass index is 35.67 kg/m.,  Hypertension, greater than 62 year old, apneic spells, fatigue - Ambulatory referral to Pulmonology  Encounter for preventive health examination Patient was encouraged to exercise greater than 150 minutes a week. Patient was encouraged to choose a diet filled with fresh fruits and vegetables, and lean meats. AVS provided to patient today for education/recommendation on gender specific health and safety maintenance. Colonoscopy: completed 7/ 2019 per pt, by [facility]> requested records. Pt reports normal.  Mammogram: completed: 09/04/2017, [facility].   Cervical cancer screening: last pap: 4/2019Neg w/ neg hpv> 3-5 yr. By prior pcp.  Immunizations: tdap UTD 05/2017, Influenza UTD 2020 (encouraged yearly), shingrix series completed, covid series completed Infectious disease screening: HIV declined today, Hep C completed today.  DEXA: per routine screen   Return in about 1 year (around 08/12/2020) for CPE (30 min) and 6 months for hypertension.  Orders Placed This Encounter  Procedures  . MM 3D SCREEN BREAST BILATERAL  . CBC  . Comprehensive metabolic panel  . Hemoglobin A1c  . Lipid panel  . TSH  . T4, free   . Hepatitis C Antibody  . Ambulatory referral to Pulmonology    Orders Placed This Encounter  Procedures  . MM 3D SCREEN BREAST BILATERAL  . CBC  . Comprehensive metabolic panel  . Hemoglobin A1c  . Lipid panel  . TSH  . T4, free  . Hepatitis C Antibody  . Ambulatory referral to Pulmonology   Meds ordered this encounter  Medications  . hydrochlorothiazide (HYDRODIURIL) 25 MG tablet    Sig: Take 0.5 tablets (12.5 mg total) by mouth daily.    Dispense:  45 tablet    Refill:  1    Referral Orders     Ambulatory referral to Pulmonology   Electronically signed by: Felix Pacini, DO [facility]

## 2019-08-14 ENCOUNTER — Telehealth: Payer: Self-pay | Admitting: Family Medicine

## 2019-08-14 DIAGNOSIS — R7989 Other specified abnormal findings of blood chemistry: Secondary | ICD-10-CM

## 2019-08-14 DIAGNOSIS — E039 Hypothyroidism, unspecified: Secondary | ICD-10-CM

## 2019-08-14 LAB — HEPATITIS C ANTIBODY
Hepatitis C Ab: NONREACTIVE
SIGNAL TO CUT-OFF: 0.02 (ref <1.00)

## 2019-08-14 MED ORDER — LEVOTHYROXINE SODIUM 112 MCG PO TABS: ORAL_TABLET | ORAL | 0 refills | Status: DC

## 2019-08-14 NOTE — Telephone Encounter (Signed)
Please inform patient. Her thyroid levels are now oversupplemented.  It is a rather drastic change from a TSH of 10 to a TSH is 0.04 with only the small change in her dose.  Recommendations: Stay on levothyroxine 112 mcg (1 tab) for 6 days a week and one day a week take only 1/2 tab. I have called in a refill for her. Also, please make sure she is taking medication on an empty stomach with water.  Avoid consuming anything but water for at least 30 minutes after taking medication-this can affect the absorption rate and may be the cause of the discrepancies be seen between the 2 labs.  Follow-up for lab appointment only in 6 to 8 weeks to recheck levels.  Order has been placed

## 2019-08-14 NOTE — Telephone Encounter (Signed)
Pt was called and given labs results/instructions, she verbalized understanding. She will have to return call to schedule lab appt

## 2019-08-19 ENCOUNTER — Encounter: Payer: Self-pay | Admitting: Family Medicine

## 2019-08-21 ENCOUNTER — Encounter: Payer: Self-pay | Admitting: Family Medicine

## 2019-08-28 ENCOUNTER — Other Ambulatory Visit: Payer: Self-pay | Admitting: Family Medicine

## 2019-08-28 DIAGNOSIS — Z1231 Encounter for screening mammogram for malignant neoplasm of breast: Secondary | ICD-10-CM

## 2019-09-11 ENCOUNTER — Ambulatory Visit
Admission: RE | Admit: 2019-09-11 | Discharge: 2019-09-11 | Disposition: A | Discharge status: Home / Self Care | Payer: BC Managed Care – PPO | Source: Ambulatory Visit

## 2019-09-11 ENCOUNTER — Other Ambulatory Visit: Payer: Self-pay

## 2019-09-11 DIAGNOSIS — Z1231 Encounter for screening mammogram for malignant neoplasm of breast: Secondary | ICD-10-CM

## 2019-09-25 ENCOUNTER — Ambulatory Visit (INDEPENDENT_AMBULATORY_CARE_PROVIDER_SITE_OTHER): Payer: BC Managed Care – PPO | Admitting: Family Medicine

## 2019-09-25 ENCOUNTER — Other Ambulatory Visit: Payer: Self-pay

## 2019-09-25 DIAGNOSIS — R7989 Other specified abnormal findings of blood chemistry: Secondary | ICD-10-CM

## 2019-09-25 DIAGNOSIS — E039 Hypothyroidism, unspecified: Secondary | ICD-10-CM

## 2019-09-26 ENCOUNTER — Telehealth: Payer: Self-pay | Admitting: Family Medicine

## 2019-09-26 DIAGNOSIS — E039 Hypothyroidism, unspecified: Secondary | ICD-10-CM

## 2019-09-26 LAB — T3, FREE: T3, Free: 3.3 pg/mL (ref 2.3–4.2)

## 2019-09-26 LAB — THYROID PEROXIDASE ANTIBODY: Thyroperoxidase Ab SerPl-aCnc: <1 IU/mL (ref <9)

## 2019-09-26 LAB — TSH: TSH: 0.02 mIU/L — ABNORMAL LOW (ref 0.40–4.50)

## 2019-09-26 LAB — T4, FREE: Free T4: 1.4 ng/dL (ref 0.8–1.8)

## 2019-09-26 MED ORDER — LEVOTHYROXINE SODIUM 100 MCG PO TABS: 100.0000 ug | ORAL_TABLET | Freq: Every day | ORAL | 0 refills | Status: DC

## 2019-09-26 NOTE — Telephone Encounter (Signed)
Pt was called and VM was left to return call  °

## 2019-09-26 NOTE — Telephone Encounter (Signed)
Pt was called and given information/results, she verbalized understanding. Appts scheduled

## 2019-09-26 NOTE — Telephone Encounter (Signed)
Please inform patient despite lowering her thyroid dose she continues to have an oversupplemented thyroid. Therefore I am going to have her go back to the original dose of levothyroxine 100 mcg a day. Uncertain the cause of the fluctuation.  Possibly patient compliance around the original high TSH reading of 10 versus medication issue at that time. If still abnormal in either direction after returning to her original dose we need to consider further evaluation on cause of fluctuations. Please schedule her for follow-up with provider in 5 weeks with lab draw 2-5 days prior.  Both must be scheduled. Thanks.

## 2019-10-28 ENCOUNTER — Other Ambulatory Visit: Payer: Self-pay

## 2019-10-28 ENCOUNTER — Encounter: Payer: Self-pay | Admitting: Internal Medicine

## 2019-10-28 ENCOUNTER — Ambulatory Visit: Payer: BC Managed Care – PPO | Admitting: Internal Medicine

## 2019-10-28 ENCOUNTER — Ambulatory Visit: Payer: BC Managed Care – PPO

## 2019-10-28 VITALS — BP 126/70 | HR 78 | Temp 98.0°F | Ht 61.5 in | Wt 184.8 lb

## 2019-10-28 DIAGNOSIS — E669 Obesity, unspecified: Secondary | ICD-10-CM

## 2019-10-28 DIAGNOSIS — G4733 Obstructive sleep apnea (adult) (pediatric): Secondary | ICD-10-CM | POA: Diagnosis not present

## 2019-10-28 NOTE — Patient Instructions (Signed)
Order- schedule home sleep test   Dx OSA  Please call us about 2 weeks after your sleep test to see if results and recommendations are ready yet. If appropriate, we may be able to start treatment before we see you next. 

## 2019-10-28 NOTE — Progress Notes (Signed)
10/28/19- 88 yoF never smoker for sleep evaluation, referred by Dr Claiborne Billings because of witnessed apnea. Medical problem list includes HTN, Hypothyroid, Obesity Epworth score 5 Body weight today 184 lbs Her husband has been noting she holds breath in sleep. Loud snoring. Not aware of other parasomnias and she denies excessive daytime tiredness. No sleep meds. Nurses a coffee and maybe a soft drink through the day. 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  Denies ENT surgery, heart or lung disease. Son and sister have OSA.  Prior to Admission medications   Medication Sig Start Date End Date Taking? Authorizing Provider  fluticasone (FLONASE) 50 MCG/ACT nasal spray Place into both nostrils daily.   Yes [provider]  hydrochlorothiazide (HYDRODIURIL) 25 MG tablet Take 0.5 tablets (12.5 mg total) by mouth daily. 08/13/19  Yes Kuneff, Renee A, DO  levothyroxine (SYNTHROID) 100 MCG tablet Take 1 tablet (100 mcg total) by mouth daily before breakfast. 09/26/19  Yes Kuneff, Renee A, DO  loratadine (CLARITIN) 10 MG tablet Take 10 mg by mouth daily.   Yes [provider]  Multiple Vitamin (MULTIVITAMIN ADULT PO) Take by mouth.   Yes [provider]   Past Medical History:  Diagnosis Date  . Allergy   . Chicken pox   . GERD (gastroesophageal reflux disease)   . Gestational diabetes   . History of UTI   . Hypothyroidism   . Nephrolithiasis 1999   Past Surgical History:  Procedure Laterality Date  . CESAREAN SECTION  1986  . EXCISION VAGINAL CYST  1998  . LITHOTRIPSY  1999  . WISDOM TOOTH EXTRACTION     Family History  Problem Relation Age of Onset  . Pulmonary fibrosis Mother        Mother also had a benign brain tumor (2000)  . Hypertension Mother   . Hypertension Father   . Hypothyroidism Father   . AAA (abdominal aortic aneurysm) Father   . Diabetes Sister   . Anxiety disorder Sister   . Hypertension Sister   . Miscarriages / Stillbirths Sister   .  Asthma Daughter   . Asthma Son   . Hypertension Son   . Aortic aneurysm Son        With repair 4 years ago.  Marland Kitchen Heart attack Maternal Grandmother   . Hypertension Maternal Grandmother   . Hypertension Maternal Grandfather   . Heart attack Maternal Grandfather   . Diabetes Maternal Grandfather   . Hypertension Paternal Grandmother   . Heart attack Paternal Grandmother   . Hypertension Paternal Grandfather    Social History   Socioeconomic History  . Marital status: Married    Spouse name: Not on file  . Number of children: Not on file  . Years of education: Not on file  . Highest education level: Not on file  Occupational History  . Not on file  Tobacco Use  . Smoking status: Never Smoker  . Smokeless tobacco: Never Used  Vaping Use  . Vaping Use: Never used  Substance and Sexual Activity  . Alcohol use: Not Currently  . Drug use: Never  . Sexual activity: Yes    Partners: Male  Other Topics Concern  . Not on file  Social History Narrative   Marital status/children/pets: Married.  3 children.   Education/employment: 16+ years of education.  Employed as a Ecologist.   Safety:      -smoke alarm in the home:Yes     - wears seatbelt: Yes     -  Feels safe in their relationships: Yes   Social Determinants of Health   Financial Resource Strain:   . Difficulty of Paying Living Expenses:   Food Insecurity:   . Worried About Charity fundraiser in the Last Year:   . Arboriculturist in the Last Year:   Transportation Needs:   . Film/video editor (Medical):   Marland Kitchen Lack of Transportation (Non-Medical):   Physical Activity:   . Days of Exercise per Week:   . Minutes of Exercise per Session:   Stress:   . Feeling of Stress :   Social Connections:   . Frequency of Communication with Friends and Family:   . Frequency of Social Gatherings with Friends and Family:   . Attends Religious Services:   . Active Member of Clubs or Organizations:   . Attends  Archivist Meetings:   Marland Kitchen Marital Status:   Intimate Partner Violence:   . Fear of Current or Ex-Partner:   . Emotionally Abused:   Marland Kitchen Physically Abused:   . Sexually Abused:    ROS-see HPI   + = positive Constitutional:    weight loss, night sweats, fevers, chills, fatigue, lassitude. HEENT:    headaches, difficulty swallowing, tooth/dental problems, sore throat,       sneezing, itching, ear ache, nasal congestion, post nasal drip, snoring CV:    chest pain, orthopnea, PND, swelling in lower extremities, anasarca,                                  dizziness, palpitations Resp:   shortness of breath with exertion or at rest.                productive cough,   non-productive cough, coughing up of blood.              change in color of mucus.  wheezing.   Skin:    rash or lesions. GI: + heartburn, indigestion, abdominal pain, nausea, vomiting, diarrhea,                 change in bowel habits, loss of appetite GU: dysuria, change in color of urine, no urgency or frequency.   flank pain. MS:   joint pain, stiffness, decreased range of motion, back pain. Neuro-     nothing unusual Psych:  change in mood or affect.  depression or anxiety.   memory loss.  OBJ- Physical Exam General- Alert, Oriented, Affect-appropriate, Distress- none acute, + obese Skin- rash-none, lesions- none, excoriation- none Lymphadenopathy- none Head- atraumatic            Eyes- Gross vision intact, PERRLA, conjunctivae and secretions clear            Ears- Hearing, canals-normal            Nose- Clear, no-Septal dev, mucus, polyps, erosion, perforation             Throat- Mallampati IV , mucosa clear , drainage- none, tonsils- atrophic,  + teeth Neck- flexible , trachea midline, no stridor , thyroid nl, carotid no bruit Chest - symmetrical excursion , unlabored           Heart/CV- RRR , no murmur , no gallop  , no rub, nl s1 s2                           - JVD-  none , edema- none, stasis changes- none,  varices- none           Lung- clear to P&A, wheeze- none, cough- none , dullness-none, rub- none           Chest wall-  Abd-  Br/ Gen/ Rectal- Not done, not indicated Extrem- cyanosis- none, clubbing, none, atrophy- none, strength- nl Neuro- grossly intact to observation

## 2019-10-28 NOTE — Assessment & Plan Note (Signed)
High probability she has OSA based on history and exam. Appropriate discussion including driving safety, testing, treatment. Questions answered. Plan- sleep study, then probably CPAP

## 2019-10-28 NOTE — Assessment & Plan Note (Signed)
Normalizing weight with associated life-style changes, is likely to be helpful and is encouraged.

## 2019-10-30 ENCOUNTER — Ambulatory Visit (INDEPENDENT_AMBULATORY_CARE_PROVIDER_SITE_OTHER): Payer: BC Managed Care – PPO | Admitting: Family Medicine

## 2019-10-30 ENCOUNTER — Other Ambulatory Visit: Payer: Self-pay

## 2019-10-30 DIAGNOSIS — E039 Hypothyroidism, unspecified: Secondary | ICD-10-CM | POA: Diagnosis not present

## 2019-10-30 LAB — TSH: TSH: 0.02 u[IU]/mL — ABNORMAL LOW (ref 0.35–4.50)

## 2019-10-31 ENCOUNTER — Ambulatory Visit: Payer: BC Managed Care – PPO | Admitting: Family Medicine

## 2019-11-05 ENCOUNTER — Encounter: Payer: Self-pay | Admitting: Family Medicine

## 2019-11-05 ENCOUNTER — Other Ambulatory Visit: Payer: Self-pay

## 2019-11-05 ENCOUNTER — Ambulatory Visit: Payer: BC Managed Care – PPO | Admitting: Family Medicine

## 2019-11-05 VITALS — BP 132/83 | HR 73 | Temp 98.3°F | Resp 18 | Ht 62.0 in | Wt 183.4 lb

## 2019-11-05 DIAGNOSIS — E039 Hypothyroidism, unspecified: Secondary | ICD-10-CM

## 2019-11-05 DIAGNOSIS — R7989 Other specified abnormal findings of blood chemistry: Secondary | ICD-10-CM | POA: Diagnosis not present

## 2019-11-05 NOTE — Progress Notes (Signed)
This visit occurred during the SARS-CoV-2 public health emergency.  Safety protocols were in place, including screening questions prior to the visit, additional usage of staff PPE, and extensive cleaning of exam room while observing appropriate contact time as indicated for disinfecting solutions.    Patient ID: Kristy Allen, female  DOB: 28-Dec-1960, 59 y.o.   MRN: [id number] Patient Care Team    Relationship Specialty Notifications Start End  Ma Hillock, DO PCP - General Family Medicine  06/03/19   Nat Christen, MD Attending Physician Optometry  06/03/19   Laurence Spates, MD (Inactive) Consulting Physician Gastroenterology  06/03/19     Chief Complaint  Patient presents with  . Hypothyroidism    Pt was taking a vit with Biotin. She also has stopped drinking coffee bc she read that can alter results of thyroid     Subjective: Kristy Allen is a 59 y.o.  Female  present for  Hypothyroidism: Patient reports she has a history of hypothyroidism going back as long as approximately 16-18 years.  Her dose was changed in [place] secondary to under replacement with a tsh of 10 and symptoms of fatigue. Thyroid repeat on levo 112 resulted with over supplement. Dose was lowered back to 100 (which was original dose) and TSH was even more over supplemented x2. Pt reports she feels well. No palpitations or fatigue and actually feels great. She has stopped her multivitamin which was new and had biotin and she has stopped drinking coffee in the morning with her thyroid med.    Depression screen [facility] 2/9 08/13/2019 06/03/2019  Decreased Interest 0 0  Down, Depressed, Hopeless 0 0  PHQ - 2 Score 0 0   No flowsheet data found.   Immunization History  Administered Date(s) Administered  . Influenza,inj,Quad PF,6+ Mos 01/21/2019, 02/14/2019  . PFIZER SARS-COV-2 Vaccination 07/13/2019, 07/30/2019  . Tdap 05/16/2017  . Zoster Recombinat (Shingrix) 01/21/2019, 03/23/2019    Past Medical History:    Diagnosis Date  . Allergy   . Chicken pox   . GERD (gastroesophageal reflux disease)   . Gestational diabetes   . History of UTI   . Hypothyroidism   . Nephrolithiasis 1999   Allergies  Allergen Reactions  . Sulfa Antibiotics Rash   Past Surgical History:  Procedure Laterality Date  . CESAREAN SECTION  1986  . EXCISION VAGINAL CYST  1998  . LITHOTRIPSY  1999  . WISDOM TOOTH EXTRACTION     Family History  Problem Relation Age of Onset  . Pulmonary fibrosis Mother        Mother also had a benign brain tumor (2000)  . Hypertension Mother   . Hypertension Father   . Hypothyroidism Father   . AAA (abdominal aortic aneurysm) Father   . Diabetes Sister   . Anxiety disorder Sister   . Hypertension Sister   . Miscarriages / Stillbirths Sister   . Asthma Daughter   . Asthma Son   . Hypertension Son   . Aortic aneurysm Son        With repair 4 years ago.  Marland Kitchen Heart attack Maternal Grandmother   . Hypertension Maternal Grandmother   . Hypertension Maternal Grandfather   . Heart attack Maternal Grandfather   . Diabetes Maternal Grandfather   . Hypertension Paternal Grandmother   . Heart attack Paternal Grandmother   . Hypertension Paternal Grandfather    Social History   Social History Narrative   Marital status/children/pets: Married.  3 children.   Education/employment: 16+  years of education.  Employed as a Ecologist.   Safety:      -smoke alarm in the home:Yes     - wears seatbelt: Yes     - Feels safe in their relationships: Yes    Allergies as of 11/05/2019      Reactions   Sulfa Antibiotics Rash      Medication List       Accurate as of November 05, 2019 10:29 AM. If you have any questions, ask your nurse or doctor.        fluticasone 50 MCG/ACT nasal spray Commonly known as: FLONASE Place into both nostrils daily.   hydrochlorothiazide 25 MG tablet Commonly known as: HYDRODIURIL Take 0.5 tablets (12.5 mg total) by mouth daily.    levothyroxine 100 MCG tablet Commonly known as: SYNTHROID Take 1 tablet (100 mcg total) by mouth daily before breakfast. What changed: additional instructions   loratadine 10 MG tablet Commonly known as: CLARITIN Take 10 mg by mouth daily.   MULTIVITAMIN ADULT PO Take by mouth.       All past medical history, surgical history, allergies, family history, immunizations andmedications were updated in the EMR today and reviewed under the history and medication portions of their EMR.     ROS: 14 pt review of systems performed and negative (unless mentioned in an HPI)  Objective: BP 132/83 (BP Location: Left Arm, Patient Position: Sitting, Cuff Size: Large)   Pulse 73   Temp 98.3 F (36.8 C) (Temporal)   Resp 18   Ht 5\' 2"  (1.575 m)   Wt 183 lb 6 oz (83.2 kg)   SpO2 97%   BMI 33.54 kg/m  Gen: Afebrile. No acute distress.  HENT: AT. [place] Eyes:Pupils Equal Round Reactive to light, Extraocular movements intact,  Conjunctiva without redness, discharge or icterus. Neck/lymp/endocrine: Supple,no lymphadenopathy, no thyromegaly CV: RRR no murmur, no edema, +2/4 P posterior tibialis pulses Chest: CTAB, no wheeze or crackles Neuro:  Normal gait. PERLA. EOMi. Alert. Oriented.  Psych: Normal affect, dress and demeanor. Normal speech. Normal thought content and judgment.  No exam data present  Assessment/plan: Kristy Allen is a 59 y.o. female present for CPE and new conditions Acquired hypothyroidism Discussed her lab results over the last 6 months with her today. No obvious cause to her fluctuations in TSH - ultrasound of thyroid ordered today.  - continue levo 100 mcg x4 days a week and 1/2 tab 3x week. (reduction of 150 mcg per week)- and lower than original dose of 100 mcg that TSH was 10 on.  - Comprehensive metabolic panel - TSH - T4, free - lab appt 6 weeks on new dose.  - instructed to take meds on empty stomach with water only- no other food, drink or meds for 30-60 minutes.   - if 46 abnormal or TSH still not in normal range> refer to endocrine.    Orders Placed This Encounter  Procedures  . US THYROID  . TSH  . T4, free  . T3, free   No orders of the defined types were placed in this encounter.  Referral Orders  No referral(s) requested today     Electronically signed by: [place], DO [facility] [place]

## 2019-11-05 NOTE — Patient Instructions (Signed)
4 days of 100 mcg and 3 days of 1/2 tab.  Lab appt in 6 weeks.   Ultrasound ordered.   Continue to take thyroid medication on its own with water only 30-60 minutes.

## 2019-11-11 ENCOUNTER — Other Ambulatory Visit: Payer: Self-pay

## 2019-11-11 ENCOUNTER — Emergency Department
Admission: RE | Admit: 2019-11-11 | Discharge: 2019-11-11 | Disposition: A | Discharge status: Home / Self Care | Payer: BC Managed Care – PPO | Source: Ambulatory Visit

## 2019-11-11 VITALS — BP 150/87 | HR 71 | Temp 98.9°F | Resp 18

## 2019-11-11 DIAGNOSIS — S61208A Unspecified open wound of other finger without damage to nail, initial encounter: Secondary | ICD-10-CM

## 2019-11-11 NOTE — ED Triage Notes (Signed)
Patient presents to Urgent Care with complaints of right middle finger laceration since two days ago. Patient reports she cut it on a mandolin while cooking, unsure of tetanus but thinks it is up to date.

## 2019-11-11 NOTE — Discharge Instructions (Signed)
  Clean wound with warm water and mild soap. Pat dry. Cover with a bandage to keep protected and clean as it heals. You can use over the counter antibiotic ointment such as Neosporin or polysporin for he next 3-4 days, you may use plain vaseline to help prevent bandage from sticking. It is okay to wash hands and shower normally without a bandage.  Change the bandage twice daily.   Follow up in 1 week if not improving, sooner if worsening- swelling, worsening pain, drainage or pus.

## 2019-11-11 NOTE — ED Provider Notes (Signed)
[facility]    CSN: [id number] Arrival date & time: 11/11/19  1245      History   Chief Complaint Chief Complaint  Patient presents with  . Appointment    1:00  . Laceration    HPI Kristy Allen is a 59 y.o. female.   HPI  Kristy Allen is a 59 y.o. female presenting to UC with c/o skin avulsion to tip of her Right middle finger, cut by a kitchen mandolin 2 days ago. Bleeding controlled PTA but pt wants to make sure it is healing well. She was not sure of her last tetanus, per medical records it was in 2019.  She called her PCP but they instructed her come to an UC.   Past Medical History:  Diagnosis Date  . Allergy   . Chicken pox   . GERD (gastroesophageal reflux disease)   . Gestational diabetes   . History of UTI   . Hypothyroidism   . Nephrolithiasis 1999    Patient Active Problem List   Diagnosis Date Noted  . Obstructive sleep apnea 10/28/2019  . Essential hypertension 08/13/2019  . Post-menopausal 06/03/2019  . Obesity (BMI 30-39.9) 06/03/2019  . Hypothyroidism     Past Surgical History:  Procedure Laterality Date  . CESAREAN SECTION  1986  . EXCISION VAGINAL CYST  1998  . LITHOTRIPSY  1999  . WISDOM TOOTH EXTRACTION      OB History    Gravida  4   Para  3   Term      Preterm      AB  1   Living  3     SAB      TAB      Ectopic      Multiple      Live Births               Home Medications    Prior to Admission medications   Medication Sig Start Date End Date Taking? Authorizing Provider  loratadine (CLARITIN) 10 MG tablet Take 10 mg by mouth daily.   Yes [provider]  fluticasone (FLONASE) 50 MCG/ACT nasal spray Place into both nostrils daily.    [provider]  hydrochlorothiazide (HYDRODIURIL) 25 MG tablet Take 0.5 tablets (12.5 mg total) by mouth daily. 08/13/19   Kuneff, Renee A, DO  levothyroxine (SYNTHROID) 100 MCG tablet Take 1 tablet (100 mcg total) by mouth daily before breakfast.  Patient taking differently: Take 100 mcg by mouth daily before breakfast. 1/2 tablet daily 09/26/19   Kuneff, Renee A, DO  Multiple Vitamin (MULTIVITAMIN ADULT PO) Take by mouth. Patient not taking: Reported on 11/05/2019    [provider]    Family History Family History  Problem Relation Age of Onset  . Pulmonary fibrosis Mother        Mother also had a benign brain tumor (2000)  . Hypertension Mother   . Hypertension Father   . Hypothyroidism Father   . AAA (abdominal aortic aneurysm) Father   . Diabetes Sister   . Anxiety disorder Sister   . Hypertension Sister   . Miscarriages / Stillbirths Sister   . Asthma Daughter   . Asthma Son   . Hypertension Son   . Aortic aneurysm Son        With repair 4 years ago.  Marland Kitchen Heart attack Maternal Grandmother   . Hypertension Maternal Grandmother   . Hypertension Maternal Grandfather   . Heart attack Maternal Grandfather   .  Diabetes Maternal Grandfather   . Hypertension Paternal Grandmother   . Heart attack Paternal Grandmother   . Hypertension Paternal Grandfather     Social History Social History   Tobacco Use  . Smoking status: Never Smoker  . Smokeless tobacco: Never Used  Vaping Use  . Vaping Use: Never used  Substance Use Topics  . Alcohol use: Not Currently  . Drug use: Never     Allergies   Sulfa antibiotics   Review of Systems Review of Systems  Musculoskeletal: Negative for arthralgias and joint swelling.  Skin: Positive for wound. Negative for color change.     Physical Exam Triage Vital Signs ED Triage Vitals  Enc Vitals Group     BP 11/11/19 1300 (!) 150/87     Pulse Rate 11/11/19 1300 71     Resp 11/11/19 1300 18     Temp 11/11/19 1300 98.9 F (37.2 C)     Temp Source 11/11/19 1300 Oral     SpO2 11/11/19 1300 99 %     Weight --      Height --      Head Circumference --      Peak Flow --      Pain Score 11/11/19 1259 0     Pain Loc --      Pain Edu? --      Excl. in GC? --     No data found.  Updated Vital Signs BP (!) 150/87 (BP Location: Left Arm)   Pulse 71   Temp 98.9 F (37.2 C) (Oral)   Resp 18   SpO2 99%   Visual Acuity Right Eye Distance:   Left Eye Distance:   Bilateral Distance:    Right Eye Near:   Left Eye Near:    Bilateral Near:     Physical Exam Constitutional:      General: She is not in acute distress.    Appearance: Normal appearance.  HENT:     Head: Normocephalic.  Cardiovascular:     Rate and Rhythm: Normal rate and regular rhythm.     Pulses:          Radial pulses are 2+ on the right side.  Musculoskeletal:        General: No swelling or tenderness. Normal range of motion.  Skin:    General: Skin is warm and dry.     Capillary Refill: Capillary refill takes less than 2 seconds.     Comments: Right middle finger, distal aspect: 2cm area of avulsed skin. Appears to be healing well. No muscle, bone or tendons visible. No foreign bodies. No active bleeding.  42mm of nail missing at the distal aspect.   Neurological:     Mental Status: She is alert.     Sensory: No sensory deficit.      UC Treatments / Results  Labs (all labs ordered are listed, but only abnormal results are displayed) Labs Reviewed - No data to display  EKG   Radiology No results found.  Procedures Procedures (including critical care time)  Medications Ordered in UC Medications - No data to display  Initial Impression / Assessment and Plan / UC Course  I have reviewed the triage vital signs and the nursing notes.  Pertinent labs & imaging results that were available during my care of the patient were reviewed by me and considered in my medical decision making (see chart for details).     Wound appears to be healing well. Reassured pt Tdap  is UTD Bacitracin and non adhesive bandage applied AVS given  Final Clinical Impressions(s) / UC Diagnoses   Final diagnoses:  Avulsion of skin of middle finger, initial encounter      Discharge Instructions      Clean wound with warm water and mild soap. Pat dry. Cover with a bandage to keep protected and clean as it heals. You can use over the counter antibiotic ointment such as Neosporin or polysporin for he next 3-4 days, you may use plain vaseline to help prevent bandage from sticking. It is okay to wash hands and shower normally without a bandage.  Change the bandage twice daily.   Follow up in 1 week if not improving, sooner if worsening- swelling, worsening pain, drainage or pus.     ED Prescriptions    None     PDMP not reviewed this encounter.   Lurene Shadow, [place] 11/11/19 1524

## 2019-11-14 ENCOUNTER — Ambulatory Visit
Admission: RE | Admit: 2019-11-14 | Discharge: 2019-11-14 | Disposition: A | Discharge status: Home / Self Care | Payer: BC Managed Care – PPO | Source: Ambulatory Visit | Attending: Family Medicine | Admitting: Family Medicine

## 2019-11-14 DIAGNOSIS — R7989 Other specified abnormal findings of blood chemistry: Secondary | ICD-10-CM

## 2019-11-14 DIAGNOSIS — E039 Hypothyroidism, unspecified: Secondary | ICD-10-CM

## 2019-12-16 ENCOUNTER — Other Ambulatory Visit: Payer: Self-pay

## 2019-12-16 ENCOUNTER — Ambulatory Visit: Payer: BC Managed Care – PPO

## 2019-12-16 DIAGNOSIS — G4733 Obstructive sleep apnea (adult) (pediatric): Secondary | ICD-10-CM

## 2019-12-17 ENCOUNTER — Ambulatory Visit (INDEPENDENT_AMBULATORY_CARE_PROVIDER_SITE_OTHER): Payer: BC Managed Care – PPO | Admitting: Family Medicine

## 2019-12-17 ENCOUNTER — Telehealth: Payer: Self-pay | Admitting: Family Medicine

## 2019-12-17 DIAGNOSIS — E039 Hypothyroidism, unspecified: Secondary | ICD-10-CM | POA: Diagnosis not present

## 2019-12-17 DIAGNOSIS — R7989 Other specified abnormal findings of blood chemistry: Secondary | ICD-10-CM

## 2019-12-17 LAB — TSH: TSH: 0.96 u[IU]/mL (ref 0.35–4.50)

## 2019-12-17 LAB — T3, FREE: T3, Free: 3.2 pg/mL (ref 2.3–4.2)

## 2019-12-17 LAB — T4, FREE: Free T4: 1.05 ng/dL (ref 0.60–1.60)

## 2019-12-17 NOTE — Telephone Encounter (Signed)
Please call patient: Good news-her thyroid panel is normal.  Please confirm levothyroxine dose and usage with patient to ensure correct dosage. I will call refills then for 1 year once we confirm usage and attempt to simplify dosage (if able), so she does not have to take multiple days of half tabs etc.      .

## 2019-12-18 NOTE — Telephone Encounter (Signed)
Left VM for patient to call back

## 2019-12-20 ENCOUNTER — Other Ambulatory Visit: Payer: Self-pay | Admitting: Family Medicine

## 2019-12-20 DIAGNOSIS — E039 Hypothyroidism, unspecified: Secondary | ICD-10-CM

## 2019-12-20 NOTE — Telephone Encounter (Signed)
Spoke with patient and she stated she is taking 1 tablet 4 times a week and 1/2 tablet 3 times a week. She states this was done so it was like she was only taking 75 MCG.

## 2019-12-20 NOTE — Telephone Encounter (Signed)
Mychart message sent to pt to verify RX

## 2019-12-23 MED ORDER — LEVOTHYROXINE SODIUM 75 MCG PO TABS: 75.0000 ug | ORAL_TABLET | Freq: Every day | ORAL | 3 refills | Status: DC

## 2019-12-23 NOTE — Telephone Encounter (Signed)
Patient advised and voiced understanding.  

## 2019-12-23 NOTE — Addendum Note (Signed)
Addended by: Felix Pacini A on: 12/23/2019 08:57 AM   Modules accepted: Orders

## 2019-12-23 NOTE — Telephone Encounter (Signed)
Levo thyroxine 75 mcg qd sent to pharmacy- 1 yr supply

## 2019-12-24 DIAGNOSIS — G4733 Obstructive sleep apnea (adult) (pediatric): Secondary | ICD-10-CM | POA: Diagnosis not present

## 2020-01-27 ENCOUNTER — Other Ambulatory Visit: Payer: Self-pay

## 2020-01-27 ENCOUNTER — Ambulatory Visit: Payer: BC Managed Care – PPO | Admitting: Internal Medicine

## 2020-01-27 ENCOUNTER — Encounter: Payer: Self-pay | Admitting: Internal Medicine

## 2020-01-27 VITALS — BP 134/84 | HR 62 | Temp 98.0°F | Wt 185.2 lb

## 2020-01-27 DIAGNOSIS — G4733 Obstructive sleep apnea (adult) (pediatric): Secondary | ICD-10-CM | POA: Diagnosis not present

## 2020-01-27 DIAGNOSIS — E669 Obesity, unspecified: Secondary | ICD-10-CM | POA: Diagnosis not present

## 2020-01-27 NOTE — Patient Instructions (Signed)
Order- referral to Dr Althea Grimmer Orthodontist    Consider oral appliance for OSA  If this doesn't work out, please call us about looking into CPAP

## 2020-01-27 NOTE — Progress Notes (Signed)
HPI  F never smoker followed for OSA, complicated by HTN, Hypothyroid, Obesity HST 12/16/19- AHI 12.1/ hr, desaturation to 78%, body weight 184 lbs  ---------------------------------------------------------------------------------------- 10/28/19- 59 yoF never smoker for sleep evaluation, referred by Dr Claiborne Billings because of witnessed apnea. Medical problem list includes HTN, Hypothyroid, Obesity Epworth score 5 Body weight today 184 lbs Her husband has been noting she holds breath in sleep. Loud snoring. Not aware of other parasomnias and she denies excessive daytime tiredness. No sleep meds. Nurses a coffee and maybe a soft drink through the day. 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  Denies ENT surgery, heart or lung disease. Son and sister have OSA.  01/27/20- 59 yoF never smoker followed for OSA, complicated by HTN, Hypothyroid, Obesity HST 12/16/19- AHI 12.1/ hr, desaturation to 78%, body weight 184 lbs Body weight today- 185 lbs Covid vax- had 2 Phizer -----discuss sleep study results We reviewed her sleep study and management of OSA. After some discussion of alternatives, she chooses to start with oral appliance.  ROS-see HPI   + = positive Constitutional:    weight loss, night sweats, fevers, chills, fatigue, lassitude. HEENT:    headaches, difficulty swallowing, tooth/dental problems, sore throat,       sneezing, itching, ear ache, nasal congestion, post nasal drip, snoring CV:    chest pain, orthopnea, PND, swelling in lower extremities, anasarca,                                  dizziness, palpitations Resp:   shortness of breath with exertion or at rest.                productive cough,   non-productive cough, coughing up of blood.              change in color of mucus.  wheezing.   Skin:    rash or lesions. GI: + heartburn, indigestion, abdominal pain, nausea, vomiting, diarrhea,                 change in bowel habits, loss of appetite GU: dysuria, change in color of  urine, no urgency or frequency.   flank pain. MS:   joint pain, stiffness, decreased range of motion, back pain. Neuro-     nothing unusual Psych:  change in mood or affect.  depression or anxiety.   memory loss.  OBJ- Physical Exam General- Alert, Oriented, Affect-appropriate, Distress- none acute, + obese Skin- rash-none, lesions- none, excoriation- none Lymphadenopathy- none Head- atraumatic            Eyes- Gross vision intact, PERRLA, conjunctivae and secretions clear            Ears- Hearing, canals-normal            Nose- Clear, no-Septal dev, mucus, polyps, erosion, perforation             Throat- Mallampati IV , mucosa clear , drainage- none, tonsils- atrophic,  + teeth Neck- flexible , trachea midline, no stridor , thyroid nl, carotid no bruit Chest - symmetrical excursion , unlabored           Heart/CV- RRR , no murmur , no gallop  , no rub, nl s1 s2                           - JVD- none , edema- none, stasis changes- none, varices-  none           Lung- clear to P&A, wheeze- none, cough- none , dullness-none, rub- none           Chest wall-  Abd-  Br/ Gen/ Rectal- Not done, not indicated Extrem- cyanosis- none, clubbing, none, atrophy- none, strength- nl Neuro- grossly intact to observation

## 2020-02-01 ENCOUNTER — Other Ambulatory Visit: Payer: Self-pay | Admitting: Family Medicine

## 2020-02-03 NOTE — Telephone Encounter (Signed)
Patient is due for appt by the end of this month, 6 months for hypertension. LM for pt to CB to schedule.

## 2020-02-06 NOTE — Telephone Encounter (Signed)
Pt returned call and scheduled f/u appt, 10/13. Will send in enough until then.

## 2020-02-08 NOTE — Assessment & Plan Note (Signed)
She is going to start with referral to Dr Myrtis Ser for oral appliance therapy for mild OSA

## 2020-02-08 NOTE — Assessment & Plan Note (Signed)
Encouraged to set her mind to long-term, sustained weight loss.

## 2020-02-26 ENCOUNTER — Ambulatory Visit: Payer: BC Managed Care – PPO | Admitting: Family Medicine

## 2020-03-06 ENCOUNTER — Encounter: Payer: Self-pay | Admitting: Family Medicine

## 2020-03-06 ENCOUNTER — Other Ambulatory Visit: Payer: Self-pay

## 2020-03-06 ENCOUNTER — Ambulatory Visit: Payer: BC Managed Care – PPO | Admitting: Family Medicine

## 2020-03-06 VITALS — BP 130/75 | HR 66 | Temp 97.6°F | Resp 16 | Ht 62.0 in | Wt 183.6 lb

## 2020-03-06 DIAGNOSIS — E669 Obesity, unspecified: Secondary | ICD-10-CM | POA: Diagnosis not present

## 2020-03-06 DIAGNOSIS — G4733 Obstructive sleep apnea (adult) (pediatric): Secondary | ICD-10-CM

## 2020-03-06 DIAGNOSIS — I10 Essential (primary) hypertension: Secondary | ICD-10-CM | POA: Diagnosis not present

## 2020-03-06 DIAGNOSIS — E039 Hypothyroidism, unspecified: Secondary | ICD-10-CM

## 2020-03-06 MED ORDER — HYDROCHLOROTHIAZIDE 25 MG PO TABS: 12.5000 mg | ORAL_TABLET | Freq: Every day | ORAL | 1 refills | Status: DC

## 2020-03-06 NOTE — Progress Notes (Signed)
This visit occurred during the SARS-CoV-2 public health emergency.  Safety protocols were in place, including screening questions prior to the visit, additional usage of staff PPE, and extensive cleaning of exam room while observing appropriate contact time as indicated for disinfecting solutions.    Patient ID: Kristy Allen, female  DOB: 04/06/1961, 59 y.o.   MRN: [id number] Patient Care Team    Relationship Specialty Notifications Start End  Natalia Leatherwood, DO PCP - General Family Medicine  06/03/19   Kerri Perches, MD Attending Physician Optometry  06/03/19   Carman Ching, MD (Inactive) Consulting Physician Gastroenterology  06/03/19     Chief Complaint  Patient presents with  . Hypertension    pt is not fasting    Subjective: Kristy Allen is a 59 y.o.  Female  present for  Hypothyroidism: doing well on levo 75 mcg qd Prior note: Patient reports she has a history of hypothyroidism going back as long as approximately 16-18 years.  Her dose was changed in [place] secondary to under replacement with a tsh of 10 and symptoms of fatigue. Thyroid repeat on levo 112 resulted with over supplement. Dose was lowered back to 100 (which was original dose) and TSH was even more over supplemented x2. Pt reports she feels well. No palpitations or fatigue and actually feels great. She has stopped her multivitamin which was new and had biotin and she has stopped drinking coffee in the morning with her thyroid med.    Essential hypertension/obesity Pt reports compliance with HCTZ 1/2 tab (12.5 mg). Blood pressures ranges at home. Patient denies chest pain, shortness of breath or lower extremity edema.   RF: Obesity, family history of heart disease  Witnessed apneic spells: Getting oral device for sleep apnea through orthodontist.  Prior note.  Patient reports her husband has noted she had apneic spells during her sleep.  She has been worked up recently for fatigue and found to have an elevated  TSH.  Her medication regimen was altered, in which she feels like she has more energy and less fatigued but is concerned she may have sleep apnea.   Depression screen [facility] 2/9 08/13/2019 06/03/2019  Decreased Interest 0 0  Down, Depressed, Hopeless 0 0  PHQ - 2 Score 0 0   No flowsheet data found.   Immunization History  Administered Date(s) Administered  . Influenza,inj,Quad PF,6+ Mos 01/21/2019, 02/14/2019  . Influenza-Unspecified 02/04/2020  . PFIZER SARS-COV-2 Vaccination 07/13/2019, 07/30/2019, 02/07/2020  . Tdap 05/16/2017  . Zoster Recombinat (Shingrix) 01/21/2019, 03/23/2019    Past Medical History:  Diagnosis Date  . Allergy   . Chicken pox   . GERD (gastroesophageal reflux disease)   . Gestational diabetes   . History of UTI   . Hypothyroidism   . Nephrolithiasis 1999   Allergies  Allergen Reactions  . Sulfa Antibiotics Rash   Past Surgical History:  Procedure Laterality Date  . CESAREAN SECTION  1986  . EXCISION VAGINAL CYST  1998  . LITHOTRIPSY  1999  . WISDOM TOOTH EXTRACTION     Family History  Problem Relation Age of Onset  . Pulmonary fibrosis Mother        Mother also had a benign brain tumor (2000)  . Hypertension Mother   . Hypertension Father   . Hypothyroidism Father   . AAA (abdominal aortic aneurysm) Father   . Diabetes Sister   . Anxiety disorder Sister   . Hypertension Sister   . Miscarriages / Stillbirths Sister   .  Asthma Daughter   . Asthma Son   . Hypertension Son   . Aortic aneurysm Son        With repair 4 years ago.  Marland Kitchen Heart attack Maternal Grandmother   . Hypertension Maternal Grandmother   . Hypertension Maternal Grandfather   . Heart attack Maternal Grandfather   . Diabetes Maternal Grandfather   . Hypertension Paternal Grandmother   . Heart attack Paternal Grandmother   . Hypertension Paternal Grandfather    Social History   Social History Narrative   Marital status/children/pets: Married.  3 children.    Education/employment: 16+ years of education.  Employed as a Ecologist.   Safety:      -smoke alarm in the home:Yes     - wears seatbelt: Yes     - Feels safe in their relationships: Yes    Allergies as of 03/06/2020      Reactions   Sulfa Antibiotics Rash      Medication List       Accurate as of March 06, 2020  3:50 PM. If you have any questions, ask your nurse or doctor.        STOP taking these medications   MULTIVITAMIN ADULT PO Stopped by: Felix Pacini, DO     TAKE these medications   fluticasone 50 MCG/ACT nasal spray Commonly known as: FLONASE Place into both nostrils daily.   hydrochlorothiazide 25 MG tablet Commonly known as: HYDRODIURIL Take 0.5 tablets (12.5 mg total) by mouth daily.   levothyroxine 75 MCG tablet Commonly known as: SYNTHROID Take 1 tablet (75 mcg total) by mouth daily before breakfast.   loratadine 10 MG tablet Commonly known as: CLARITIN Take 10 mg by mouth daily.       All past medical history, surgical history, allergies, family history, immunizations andmedications were updated in the EMR today and reviewed under the history and medication portions of their EMR.     ROS: 14 pt review of systems performed and negative (unless mentioned in an HPI)  Objective: BP 130/75 (BP Location: Right Arm, Patient Position: Sitting, Cuff Size: Normal)   Pulse 66   Temp 97.6 F (36.4 C) (Oral)   Resp 16   Ht 5\' 2"  (1.575 m)   Wt 183 lb 9.6 oz (83.3 kg)   SpO2 100%   BMI 33.58 kg/m  Gen: Afebrile. No acute distress.  HENT: AT. [place].  Eyes:Pupils Equal Round Reactive to light, Extraocular movements intact,  Conjunctiva without redness, discharge or icterus. Neck/lymp/endocrine: Supple,no lymphadenopathy, no thyromegaly CV: RRR no murmur, no edema, +2/4 P posterior tibialis pulses Chest: CTAB, no wheeze or crackles Abd: Soft. NTND. BS present Neuro: Normal gait. PERLA. EOMi. Alert. Oriented x3 Psych: Normal affect,  dress and demeanor. Normal speech. Normal thought content and judgment.  No exam data present  Assessment/plan: Kristy Allen is a 59 y.o. female present for CPE and new conditions Acquired hypothyroidism Stable.  - ultrasound of thyroid normal.  - continue levo 75 mcg mcg   Essential hypertension/obesity Stable.  Continue  HCTZ 12.5 mg daily in the morning. -Low-sodium diet -Increase exercise regimen Follow-up 5.5 months  No orders of the defined types were placed in this encounter.  Meds ordered this encounter  Medications  . hydrochlorothiazide (HYDRODIURIL) 25 MG tablet    Sig: Take 0.5 tablets (12.5 mg total) by mouth daily.    Dispense:  45 tablet    Refill:  1   Referral Orders  No referral(s) requested today  Electronically signed by: Howard Pouch, DO [place]

## 2020-03-06 NOTE — Patient Instructions (Signed)
Great to see you today.  I refilled your medications.

## 2020-05-05 ENCOUNTER — Encounter: Payer: Self-pay | Admitting: Family Medicine

## 2020-07-22 ENCOUNTER — Telehealth: Payer: Self-pay | Admitting: Family Medicine

## 2020-07-22 MED ORDER — HYDROCHLOROTHIAZIDE 25 MG PO TABS: 12.5000 mg | ORAL_TABLET | Freq: Every day | ORAL | 0 refills | Status: DC

## 2020-07-22 NOTE — Telephone Encounter (Signed)
90 d/s sent.  ?

## 2020-07-22 NOTE — Telephone Encounter (Signed)
Patient rescheduled 08/31/20 CPE due to provider out of office. 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 Please send one additional refill to pharmacy to get her through to new appt.

## 2020-08-31 ENCOUNTER — Encounter: Payer: BC Managed Care – PPO | Admitting: Family Medicine

## 2020-10-22 ENCOUNTER — Encounter: Payer: Self-pay | Admitting: Family Medicine

## 2020-10-22 ENCOUNTER — Ambulatory Visit (INDEPENDENT_AMBULATORY_CARE_PROVIDER_SITE_OTHER): Payer: BC Managed Care – PPO | Admitting: Family Medicine

## 2020-10-22 ENCOUNTER — Other Ambulatory Visit: Payer: Self-pay

## 2020-10-22 VITALS — BP 136/82 | HR 65 | Temp 97.7°F | Ht 62.0 in | Wt 187.0 lb

## 2020-10-22 DIAGNOSIS — Z1231 Encounter for screening mammogram for malignant neoplasm of breast: Secondary | ICD-10-CM

## 2020-10-22 DIAGNOSIS — I10 Essential (primary) hypertension: Secondary | ICD-10-CM | POA: Diagnosis not present

## 2020-10-22 DIAGNOSIS — E669 Obesity, unspecified: Secondary | ICD-10-CM | POA: Diagnosis not present

## 2020-10-22 DIAGNOSIS — Z131 Encounter for screening for diabetes mellitus: Secondary | ICD-10-CM | POA: Diagnosis not present

## 2020-10-22 DIAGNOSIS — Z0000 Encounter for general adult medical examination without abnormal findings: Secondary | ICD-10-CM | POA: Diagnosis not present

## 2020-10-22 DIAGNOSIS — E039 Hypothyroidism, unspecified: Secondary | ICD-10-CM

## 2020-10-22 DIAGNOSIS — G4733 Obstructive sleep apnea (adult) (pediatric): Secondary | ICD-10-CM

## 2020-10-22 LAB — COMPREHENSIVE METABOLIC PANEL
ALT: 25 U/L (ref 0–35)
AST: 18 U/L (ref 0–37)
Albumin: 4.2 g/dL (ref 3.5–5.2)
Alkaline Phosphatase: 93 U/L (ref 39–117)
BUN: 14 mg/dL (ref 6–23)
CO2: 28 mEq/L (ref 19–32)
Calcium: 9.4 mg/dL (ref 8.4–10.5)
Chloride: 101 mEq/L (ref 96–112)
Creatinine, Ser: 0.78 mg/dL (ref 0.40–1.20)
GFR: 82.81 mL/min (ref >60.00)
Glucose, Bld: 102 mg/dL — ABNORMAL HIGH (ref 70–99)
Potassium: 4 mEq/L (ref 3.5–5.1)
Sodium: 138 mEq/L (ref 135–145)
Total Bilirubin: 0.6 mg/dL (ref 0.2–1.2)
Total Protein: 7.1 g/dL (ref 6.0–8.3)

## 2020-10-22 LAB — LIPID PANEL
Cholesterol: 214 mg/dL — ABNORMAL HIGH (ref 0–200)
HDL: 60.1 mg/dL (ref >39.00)
LDL Cholesterol: 134 mg/dL — ABNORMAL HIGH (ref 0–99)
NonHDL: 154.03
Total CHOL/HDL Ratio: 4
Triglycerides: 98 mg/dL (ref 0.0–149.0)
VLDL: 19.6 mg/dL (ref 0.0–40.0)

## 2020-10-22 LAB — CBC WITH DIFFERENTIAL/PLATELET
Basophils Absolute: 0.1 10*3/uL (ref 0.0–0.1)
Basophils Relative: 1.1 % (ref 0.0–3.0)
Eosinophils Absolute: 0.2 10*3/uL (ref 0.0–0.7)
Eosinophils Relative: 2.7 % (ref 0.0–5.0)
HCT: 40.7 % (ref 36.0–46.0)
Hemoglobin: 13.6 g/dL (ref 12.0–15.0)
Lymphocytes Relative: 31.2 % (ref 12.0–46.0)
Lymphs Abs: 1.9 10*3/uL (ref 0.7–4.0)
MCHC: 33.4 g/dL (ref 30.0–36.0)
MCV: 85.2 fl (ref 78.0–100.0)
Monocytes Absolute: 0.4 10*3/uL (ref 0.1–1.0)
Monocytes Relative: 7.2 % (ref 3.0–12.0)
Neutro Abs: 3.6 10*3/uL (ref 1.4–7.7)
Neutrophils Relative %: 57.8 % (ref 43.0–77.0)
Platelets: 446 10*3/uL — ABNORMAL HIGH (ref 150.0–400.0)
RBC: 4.78 Mil/uL (ref 3.87–5.11)
RDW: 15.2 % (ref 11.5–15.5)
WBC: 6.2 10*3/uL (ref 4.0–10.5)

## 2020-10-22 LAB — T4, FREE: Free T4: 1.06 ng/dL (ref 0.60–1.60)

## 2020-10-22 LAB — HEMOGLOBIN A1C: Hgb A1c MFr Bld: 6.2 % (ref 4.6–6.5)

## 2020-10-22 LAB — TSH: TSH: 1.07 u[IU]/mL (ref 0.35–4.50)

## 2020-10-22 MED ORDER — HYDROCHLOROTHIAZIDE 25 MG PO TABS: 12.5000 mg | ORAL_TABLET | Freq: Every day | ORAL | 1 refills | Status: DC

## 2020-10-22 NOTE — Patient Instructions (Signed)
Health Maintenance, Female Adopting a healthy lifestyle and getting preventive care are important in promoting health and wellness. Ask your health care provider about:  The right schedule for you to have regular tests and exams.  Things you can do on your own to prevent diseases and keep yourself healthy. What should I know about diet, weight, and exercise? Eat a healthy diet  Eat a diet that includes plenty of vegetables, fruits, low-fat dairy products, and lean protein.  Do not eat a lot of foods that are high in solid fats, added sugars, or sodium.   Maintain a healthy weight Body mass index (BMI) is used to identify weight problems. It estimates body fat based on height and weight. Your health care provider can help determine your BMI and help you achieve or maintain a healthy weight. Get regular exercise Get regular exercise. This is one of the most important things you can do for your health. Most adults should:  Exercise for at least 150 minutes each week. The exercise should increase your heart rate and make you sweat (moderate-intensity exercise).  Do strengthening exercises at least twice a week. This is in addition to the moderate-intensity exercise.  Spend less time sitting. Even light physical activity can be beneficial. Watch cholesterol and blood lipids Have your blood tested for lipids and cholesterol at 60 years of age, then have this test every 5 years. Have your cholesterol levels checked more often if:  Your lipid or cholesterol levels are high.  You are older than 60 years of age.  You are at high risk for heart disease. What should I know about cancer screening? Depending on your health history and family history, you may need to have cancer screening at various ages. This may include screening for:  Breast cancer.  Cervical cancer.  Colorectal cancer.  Skin cancer.  Lung cancer. What should I know about heart disease, diabetes, and high blood  pressure? Blood pressure and heart disease  High blood pressure causes heart disease and increases the risk of stroke. This is more likely to develop in people who have high blood pressure readings, are of African descent, or are overweight.  Have your blood pressure checked: ? Every 3-5 years if you are 18-39 years of age. ? Every year if you are 40 years old or older. Diabetes Have regular diabetes screenings. This checks your fasting blood sugar level. Have the screening done:  Once every three years after age 40 if you are at a normal weight and have a low risk for diabetes.  More often and at a younger age if you are overweight or have a high risk for diabetes. What should I know about preventing infection? Hepatitis B If you have a higher risk for hepatitis B, you should be screened for this virus. Talk with your health care provider to find out if you are at risk for hepatitis B infection. Hepatitis C Testing is recommended for:  Everyone born from 1945 through 1965.  Anyone with known risk factors for hepatitis C. Sexually transmitted infections (STIs)  Get screened for STIs, including gonorrhea and chlamydia, if: ? You are sexually active and are younger than 60 years of age. ? You are older than 60 years of age and your health care provider tells you that you are at risk for this type of infection. ? Your sexual activity has changed since you were last screened, and you are at increased risk for chlamydia or gonorrhea. Ask your health care provider   if you are at risk.  Ask your health care provider about whether you are at high risk for HIV. Your health care provider may recommend a prescription medicine to help prevent HIV infection. If you choose to take medicine to prevent HIV, you should first get tested for HIV. You should then be tested every 3 months for as long as you are taking the medicine. Pregnancy  If you are about to stop having your period (premenopausal) and  you may become pregnant, seek counseling before you get pregnant.  Take 400 to 800 micrograms (mcg) of folic acid every day if you become pregnant.  Ask for birth control (contraception) if you want to prevent pregnancy. Osteoporosis and menopause Osteoporosis is a disease in which the bones lose minerals and strength with aging. This can result in bone fractures. If you are 65 years old or older, or if you are at risk for osteoporosis and fractures, ask your health care provider if you should:  Be screened for bone loss.  Take a calcium or vitamin D supplement to lower your risk of fractures.  Be given hormone replacement therapy (HRT) to treat symptoms of menopause. Follow these instructions at home: Lifestyle  Do not use any products that contain nicotine or tobacco, such as cigarettes, e-cigarettes, and chewing tobacco. If you need help quitting, ask your health care provider.  Do not use street drugs.  Do not share needles.  Ask your health care provider for help if you need support or information about quitting drugs. Alcohol use  Do not drink alcohol if: ? Your health care provider tells you not to drink. ? You are pregnant, may be pregnant, or are planning to become pregnant.  If you drink alcohol: ? Limit how much you use to 0-1 drink a day. ? Limit intake if you are breastfeeding.  Be aware of how much alcohol is in your drink. In the [place] one drink equals one 12 oz bottle of beer (355 mL), one 5 oz glass of wine (148 mL), or one 1 oz glass of hard liquor (44 mL). General instructions  Schedule regular health, dental, and eye exams.  Stay current with your vaccines.  Tell your health care provider if: ? You often feel depressed. ? You have ever been abused or do not feel safe at home. Summary  Adopting a healthy lifestyle and getting preventive care are important in promoting health and wellness.  Follow your health care provider's instructions about healthy  diet, exercising, and getting tested or screened for diseases.  Follow your health care provider's instructions on monitoring your cholesterol and blood pressure. This information is not intended to replace advice given to you by your health care provider. Make sure you discuss any questions you have with your health care provider. Document Revised: 04/25/2018 Document Reviewed: 04/25/2018 Elsevier Patient Education  2021 [employer]  

## 2020-10-22 NOTE — Progress Notes (Signed)
This visit occurred during the SARS-CoV-2 public health emergency.  Safety protocols were in place, including screening questions prior to the visit, additional usage of staff PPE, and extensive cleaning of exam room while observing appropriate contact time as indicated for disinfecting solutions.    Patient ID: Kristy Allen, female  DOB: 1960-07-18, 60 y.o.   MRN: [id number] Patient Care Team    Relationship Specialty Notifications Start End  Natalia Leatherwood, DO PCP - General Family Medicine  06/03/19   Kerri Perches, MD Attending Physician Optometry  06/03/19   Carman Ching, MD (Inactive) Consulting Physician Gastroenterology  06/03/19     Chief Complaint  Patient presents with   Annual Exam    Pt is fasting     Subjective:  Kristy Allen is a 60 y.o.  Female  present for CPE/CMC. All past medical history, surgical history, allergies, family history, immunizations, medications and social history were updated in the electronic medical record today. All recent labs, ED visits and hospitalizations within the last year were reviewed.  Health maintenance:  Colonoscopy: completed 7/ 2019 per pt, by [facility]> requested records. Pt reports normal.  Mammogram: completed: 08/2019, [facility].   Cervical cancer screening: last pap: 4/2019Neg w/ neg hpv 5 yr. By prior pcp. Immunizations: tdap UTD 05/2017, Influenza UTD(encouraged yearly), shingrix series completed, covid series completed Infectious disease screening: HIV declined, Hep C completed  DEXA: per routine screen Assistive device: none Oxygen GMW:NUUV Patient has a Dental home. Hospitalizations/ED visits: reviewed  Hypothyroidism: doing well on levo 100 mcg qd. Compliant with med.  Prior note: Patient reports she has a history of hypothyroidism going back as long as approximately 16-18 years.  Her dose was changed in [place] secondary to under replacement with a tsh of 10 and symptoms of fatigue. Thyroid repeat on levo 112 resulted  with over supplement. Dose was lowered back to 100 (which was original dose) and TSH was even more over supplemented x2. Pt reports she feels well. No palpitations or fatigue and actually feels great. She has stopped her multivitamin which was new and had biotin and she has stopped drinking coffee in the morning with her thyroid med.     Essential hypertension/obesity Pt reports compliance with HCTZ 1/2 tab (12.5 mg). Blood pressures ranges at home. Patient denies chest pain, shortness of breath, dizziness or lower extremity edema.   RF: Obesity, family history of heart disease   Witnessed apneic spells: She has started using the oral device through dentistry and states it is going well.  Prior note.  Patient reports her husband has noted she had apneic spells during her sleep.  She has been worked up recently for fatigue and found to have an elevated TSH.  Her medication regimen was altered, in which she feels like she has more energy and less fatigued but is concerned she may have sleep apnea.    Depression screen [facility] 2/9 10/22/2020 08/13/2019 06/03/2019  Decreased Interest 0 0 0  Down, Depressed, Hopeless 0 0 0  PHQ - 2 Score 0 0 0   No flowsheet data found.  Immunization History  Administered Date(s) Administered   Influenza,inj,Quad PF,6+ Mos 01/21/2019, 02/14/2019   Influenza-Unspecified 02/04/2020   PFIZER Comirnaty(Gray Top)Covid-19 Tri-Sucrose Vaccine 09/26/2020   PFIZER(Purple Top)SARS-COV-2 Vaccination 07/13/2019, 07/30/2019, 02/07/2020   Tdap 05/16/2017   Typhoid Live 09/01/2020   Yellow Fever 09/01/2020   Zoster Recombinat (Shingrix) 01/21/2019, 03/23/2019     Past Medical History:  Diagnosis Date   Allergy  Chicken pox    GERD (gastroesophageal reflux disease)    Gestational diabetes    History of UTI    Hypothyroidism    Nephrolithiasis 1999   Allergies  Allergen Reactions   Sulfa Antibiotics Rash   Past Surgical History:  Procedure Laterality Date    CESAREAN SECTION  1986   EXCISION VAGINAL CYST  1998   LITHOTRIPSY  1999   WISDOM TOOTH EXTRACTION     Family History  Problem Relation Age of Onset   Pulmonary fibrosis Mother        Mother also had a benign brain tumor (2000)   Hypertension Mother    Hypertension Father    Hypothyroidism Father    AAA (abdominal aortic aneurysm) Father    Diabetes Sister    Anxiety disorder Sister    Hypertension Sister    Miscarriages / Stillbirths Sister    Asthma Daughter    Asthma Son    Hypertension Son    Aortic aneurysm Son        With repair 4 years ago.   Heart attack Maternal Grandmother    Hypertension Maternal Grandmother    Hypertension Maternal Grandfather    Heart attack Maternal Grandfather    Diabetes Maternal Grandfather    Hypertension Paternal Grandmother    Heart attack Paternal Grandmother    Hypertension Paternal Grandfather    Social History   Social History Narrative   Marital status/children/pets: Married.  3 children.   Education/employment: 16+ years of education.  Employed as a Ecologist.   Safety:      -smoke alarm in the home:Yes     - wears seatbelt: Yes     - Feels safe in their relationships: Yes    Allergies as of 10/22/2020       Reactions   Sulfa Antibiotics Rash        Medication List        Accurate as of October 22, 2020 10:34 AM. If you have any questions, ask your nurse or doctor.          fluticasone 50 MCG/ACT nasal spray Commonly known as: FLONASE Place into both nostrils daily.   hydrochlorothiazide 25 MG tablet Commonly known as: HYDRODIURIL Take 0.5 tablets (12.5 mg total) by mouth daily.   levothyroxine 75 MCG tablet Commonly known as: SYNTHROID Take 1 tablet (75 mcg total) by mouth daily before breakfast.   loratadine 10 MG tablet Commonly known as: CLARITIN Take 10 mg by mouth daily.        All past medical history, surgical history, allergies, family history, immunizations andmedications  were updated in the EMR today and reviewed under the history and medication portions of their EMR.     No results found for this or any previous visit (from the past 2160 hour(s)).  US THYROID  Result Date: 11/14/2019 CLINICAL DATA:  Elevated TSH. EXAM: THYROID ULTRASOUND TECHNIQUE: Ultrasound examination of the thyroid gland and adjacent soft tissues was performed. COMPARISON:  None. FINDINGS: Parenchymal Echotexture: Normal Isthmus: 0.3 cm Right lobe: 2.5 x 1.3 x 1.2 cm Left lobe: 2.8 x 0.9 x 1 cm _________________________________________________________ Estimated total number of nodules >/= 1 cm: 0 Number of spongiform nodules >/=  2 cm not described below (TR1): 0 Number of mixed cystic and solid nodules >/= 1.5 cm not described below (TR2): 0 _________________________________________________________ No discrete nodules are seen within the thyroid gland. IMPRESSION: Small thyroid gland without evidence for distinct thyroid nodule. The above is in  keeping with the ACR TI-RADS recommendations - J Am Coll Radiol 2017;14:587-595. Electronically Signed   By: Katherine Mantle M.D.   On: 11/14/2019 16:53     ROS: 14 pt review of systems performed and negative (unless mentioned in an HPI)  Objective: BP 136/82   Pulse 65   Temp 97.7 F (36.5 C) (Oral)   Ht 5\' 2"  (1.575 m)   Wt 187 lb (84.8 kg)   SpO2 100%   BMI 34.20 kg/m  Gen: Afebrile. No acute distress. Nontoxic in appearance, well-developed, well-nourished,  very pleasant female  HENT: AT. [place]. Bilateral TM visualized and normal in appearance, normal external auditory canal. MMM, no oral lesions, adequate dentition. Bilateral nares within normal limits. Throat without erythema, ulcerations or exudates. no Cough on exam, no hoarseness on exam. Eyes:Pupils Equal Round Reactive to light, Extraocular movements intact,  Conjunctiva without redness, discharge or icterus. Neck/lymp/endocrine: Supple,no lymphadenopathy, no thyromegaly CV: RRR no  murmur, no edema, +2/4 P posterior tibialis pulses.  Chest: CTAB, no wheeze, rhonchi or crackles. normal Respiratory effort. good Air movement. Abd: Soft. flat. NTND. BS present. no Masses palpated. No hepatosplenomegaly. No rebound tenderness or guarding. Skin: no rashes, purpura or petechiae. Warm and well-perfused. Skin intact. Neuro/Msk:  Normal gait. PERLA. EOMi. Alert. Oriented x3.  Cranial nerves II through XII intact. Muscle strength 5/5 upper/lower extremity. DTRs equal bilaterally. Psych: Normal affect, dress and demeanor. Normal speech. Normal thought content and judgment.   No results found.  Assessment/plan: Shakeda Pearse is a 60 y.o. female present for CPE.CMC Acquired hypothyroidism Recent change in med.  - ultrasound of thyroid normal.  - continue levo 75 mcg mcg- refills after labs - tsh, t4 free   Essential hypertension/obesity stable Continue  HCTZ 12.5 mg daily in the morning. -Low-sodium diet -Increase exercise regimen Cbc, cmp, tsh, lipid Follow-up 5.5 months  Obstructive sleep apnea Continue to follow with dentistry  Diabetes mellitus screening - Hemoglobin A1c Breast cancer screening by mammogram - MM 3D SCREEN BREAST BILATERAL; Future  Routine general medical examination at a health care facility Colonoscopy: completed 7/ 2019 per pt, by [facility]> requested records. Pt reports normal.  Mammogram: completed: 08/2019, [facility].   Cervical cancer screening: last pap: 4/2019Neg w/ neg hpv 5 yr. By prior pcp. Immunizations: tdap UTD 05/2017, Influenza UTD(encouraged yearly), shingrix series completed, covid series completed Infectious disease screening: HIV declined, Hep C completed  DEXA: per routine screen Patient was encouraged to exercise greater than 150 minutes a week. Patient was encouraged to choose a diet filled with fresh fruits and vegetables, and lean meats. AVS provided to patient today for education/recommendation on gender specific health and  safety maintenance. Return in about 1 year (around 10/22/2021) for CPE (30 min) and 5.5 mos CMC.   Orders Placed This Encounter  Procedures   MM 3D SCREEN BREAST BILATERAL   CBC with Differential/Platelet   Comprehensive metabolic panel   Lipid panel   Hemoglobin A1c   TSH   T4, free   Meds ordered this encounter  Medications   hydrochlorothiazide (HYDRODIURIL) 25 MG tablet    Sig: Take 0.5 tablets (12.5 mg total) by mouth daily.    Dispense:  45 tablet    Refill:  1    MUST HAVE OV FOR FURTHER REFILLS   Referral Orders  No referral(s) requested today     Electronically signed by: 12/22/2021, DO [facility] [place]

## 2020-10-23 ENCOUNTER — Telehealth: Payer: Self-pay | Admitting: Family Medicine

## 2020-10-23 DIAGNOSIS — E039 Hypothyroidism, unspecified: Secondary | ICD-10-CM

## 2020-10-23 MED ORDER — LEVOTHYROXINE SODIUM 75 MCG PO TABS: 75.0000 ug | ORAL_TABLET | Freq: Every day | ORAL | 3 refills | Status: DC

## 2020-10-23 NOTE — Telephone Encounter (Signed)
Please call patient Liver, kidney and thyroid function are normal> I have refilled her thyroid medication. Blood cell counts and electrolytes are normal  Diabetes screening/A1c is mildly elevated at 6.2 and a fasting sugar of 102.  This is prediabetic range.  Cholesterol panel looks most exactly the same as last year with the LDL of 134 this year.  This is at goal for her.  As far as the "prediabetes, "I would recommend increasing exercise activity to at least 150 minutes a week of cardiovascular exercise.  Follow a lower sugar/complex carbohydrate diet and focus on lean meats, vegetables, fruits and high-fiber.  No meds needed at this time, however if it continues to rise next year we would need to consider starting medication.

## 2020-10-23 NOTE — Telephone Encounter (Signed)
LVM for pt to CB regarding results.  

## 2020-10-23 NOTE — Telephone Encounter (Signed)
Spoke with pt regarding labs and instructions.   

## 2021-03-20 IMAGING — US US THYROID
1 series · 14 of 25 positions shown · non-contrast
Comparison: None.

CLINICAL DATA: Elevated TSH.

EXAM:
THYROID ULTRASOUND
TECHNIQUE: Ultrasound examination of the thyroid gland and adjacent soft
tissues was performed.

[Series 1: us thyroid · 0.05mm/px · 14 of 34 slices shown]
[im 1/34]
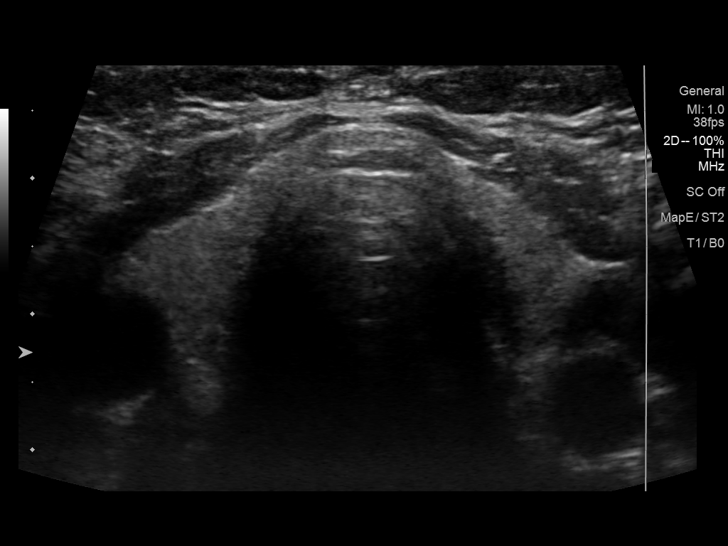
[im 3/34]
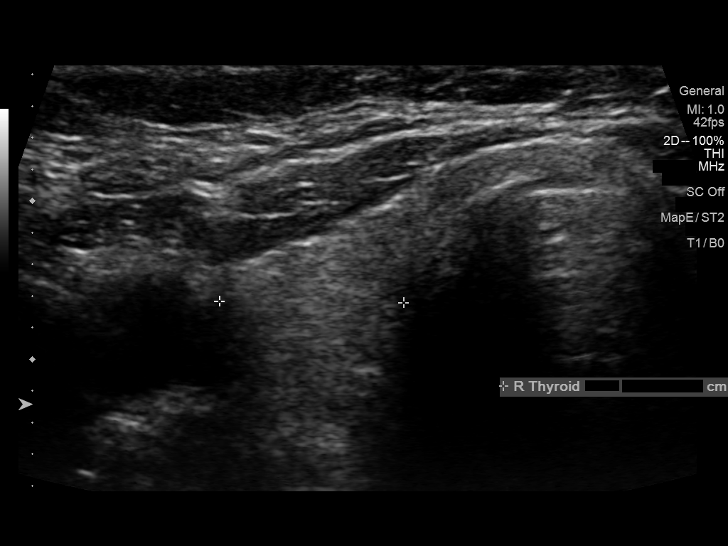
[im 6/34]
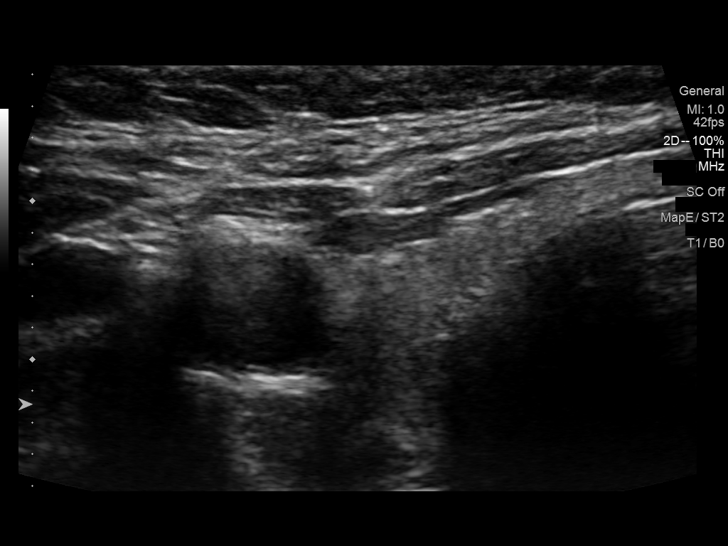
[im 9/34]
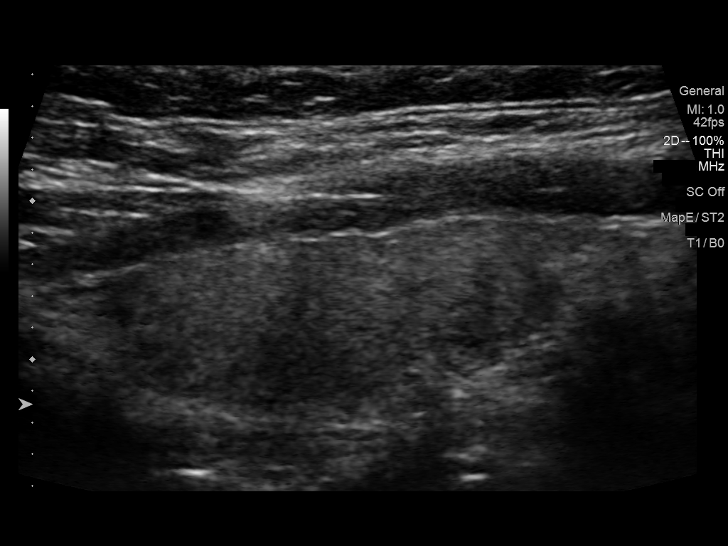
[im 12/34]
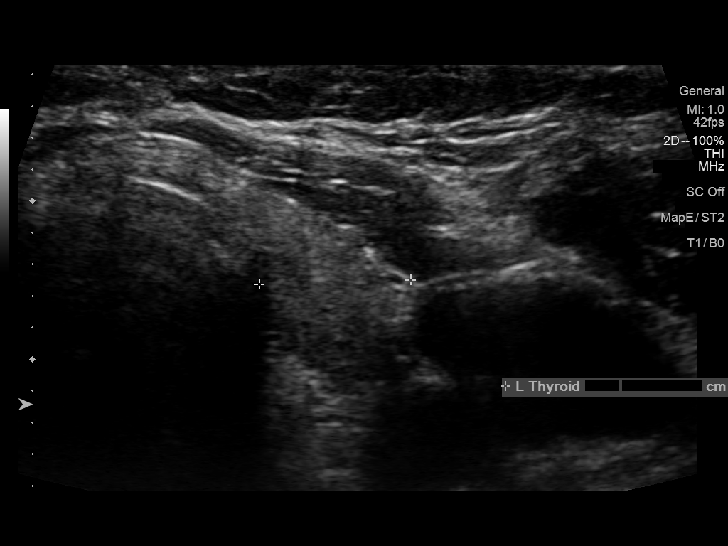
[im 13/34]
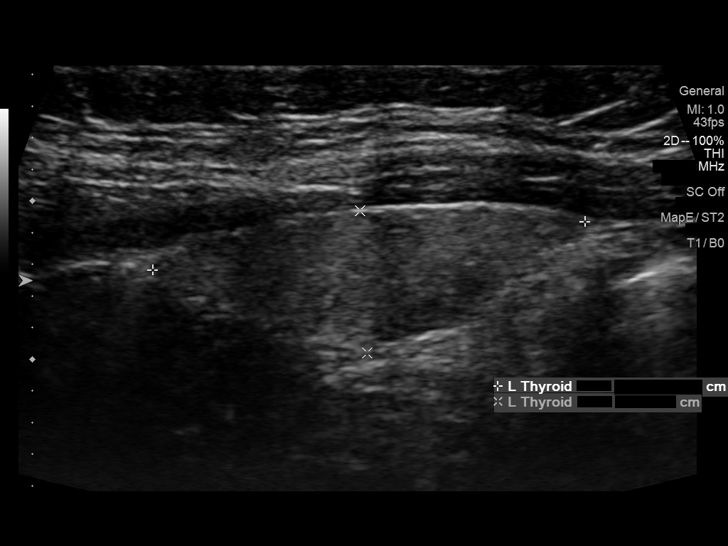
[im 16/34]
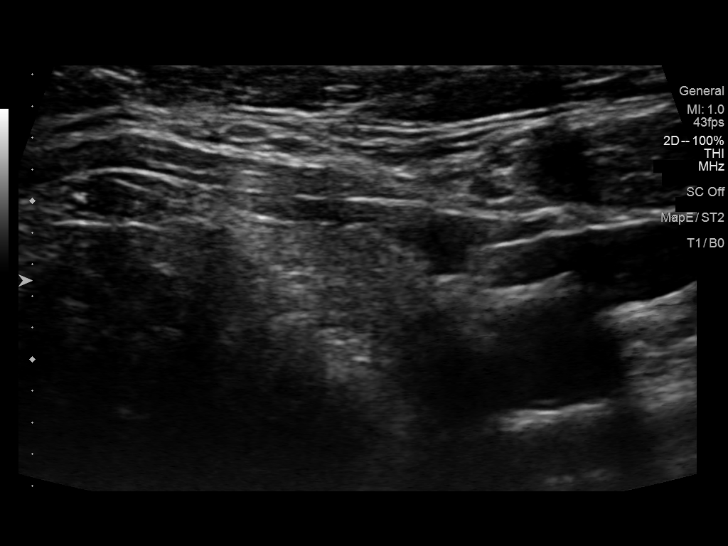
[im 18/34]
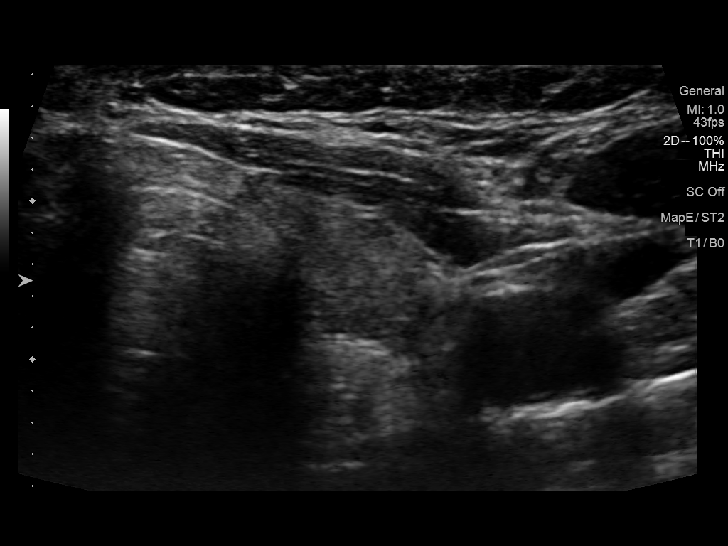
[im 21/34]
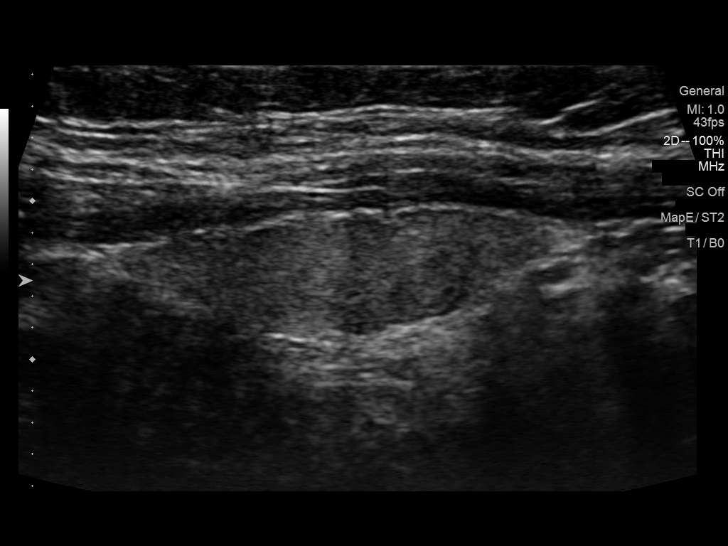
[im 23/34]
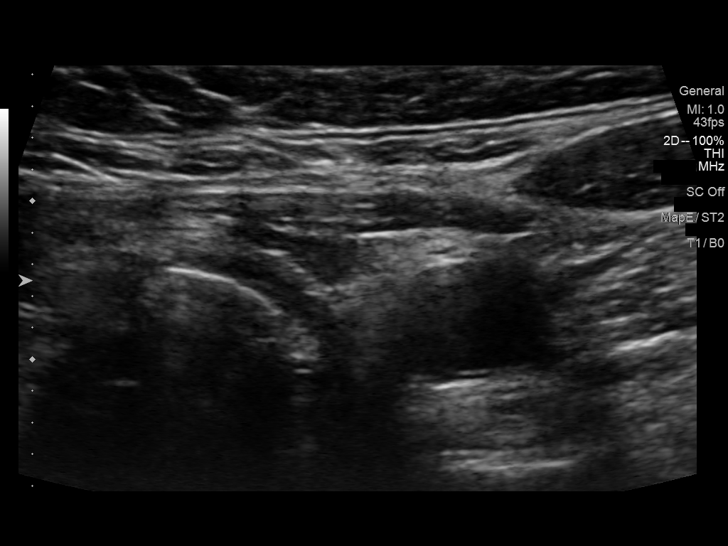
[im 25/34]
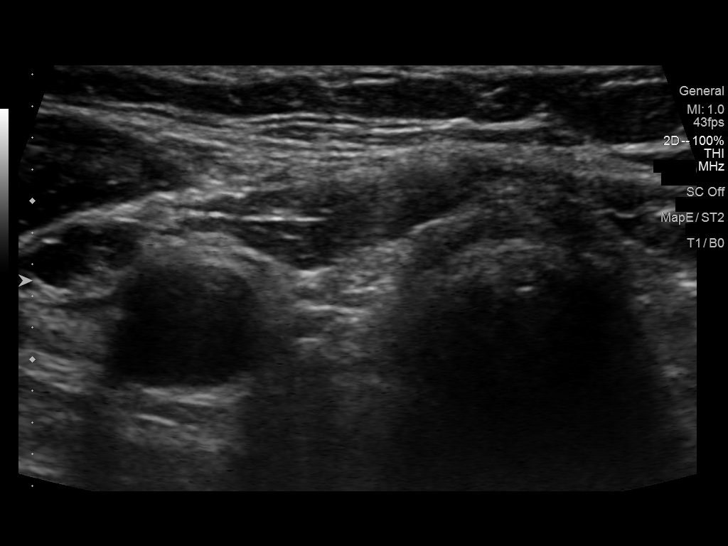
[im 28/34]
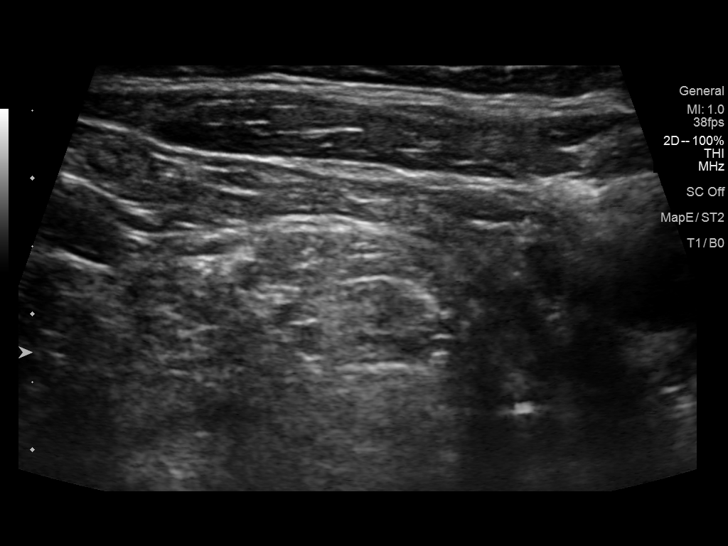
[im 31/34]
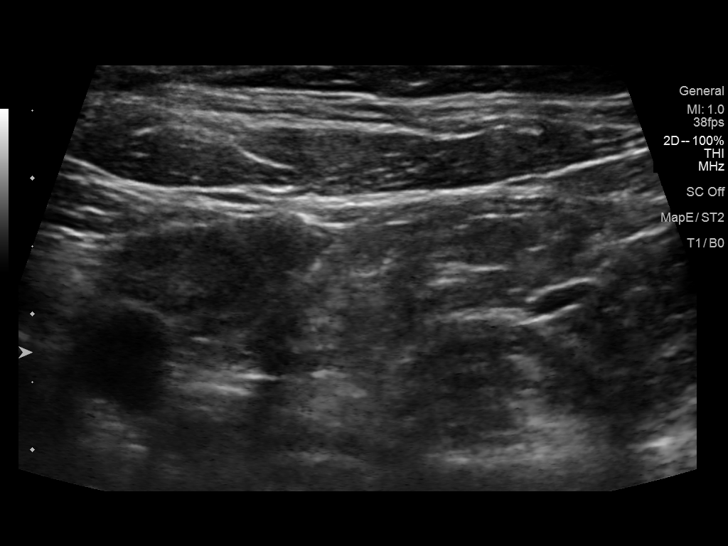
[im 34/34]
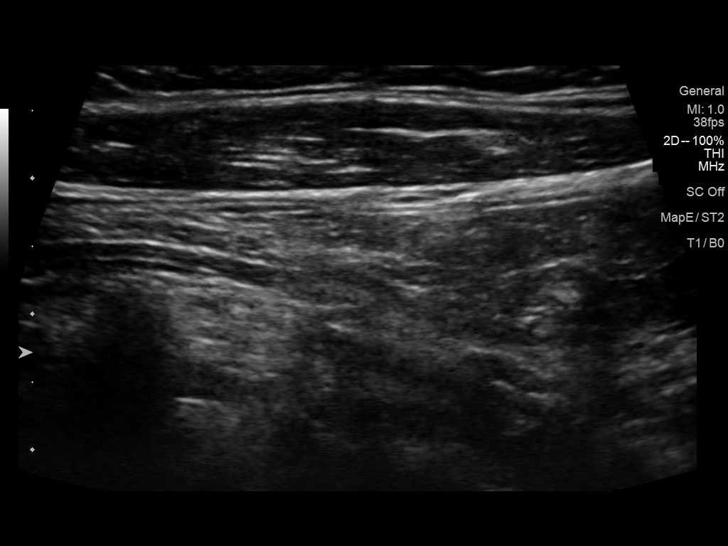

[14 of 25 positions shown; findings below may reference images not displayed]

FINDINGS: Parenchymal Echotexture: Normal

Isthmus: 0.3 cm

Right lobe: 2.5 x 1.3 x 1.2 cm

Left lobe: 2.8 x 0.9 x 1 cm

_________________________________________________________

Estimated total number of nodules >/= 1 cm: 0

Number of spongiform nodules >/=  2 cm not described below (TR1): 0

Number of mixed cystic and solid nodules >/= 1.5 cm not described
below (TR2): 0

_________________________________________________________

No discrete nodules are seen within the thyroid gland.
IMPRESSION: Small thyroid gland without evidence for distinct thyroid nodule.

The above is in keeping with the ACR TI-RADS recommendations - [HOSPITAL] 2987;[DATE].

## 2021-04-07 ENCOUNTER — Telehealth: Payer: BC Managed Care – PPO | Admitting: Family Medicine

## 2021-04-07 ENCOUNTER — Encounter: Payer: Self-pay | Admitting: Family Medicine

## 2021-04-07 VITALS — Temp 99.0°F | Ht 62.0 in | Wt 179.0 lb

## 2021-04-07 DIAGNOSIS — J111 Influenza due to unidentified influenza virus with other respiratory manifestations: Secondary | ICD-10-CM | POA: Diagnosis not present

## 2021-04-07 MED ORDER — OSELTAMIVIR PHOSPHATE 75 MG PO CAPS: 75.0000 mg | ORAL_CAPSULE | Freq: Two times a day (BID) | ORAL | 0 refills | Status: DC

## 2021-04-07 NOTE — Progress Notes (Signed)
Virtual Visit via Video Note  I connected with pt on 04/07/21 at 11:00 AM EST by a video enabled telemedicine application and verified that I am speaking with the correct person using two identifiers.  Location patient: home, [place] Location provider:work or home office Persons participating in the virtual visit: patient, provider  I discussed the limitations of evaluation and management by telemedicine and the availability of in person appointments. The patient expressed understanding and agreed to proceed.  HPI: 60 y/o female being seen today for respiratory symptoms. Onset about 24 hours ago of malaise, achiness, stuffy nose, headache, cough.  Some subjective fever and chills but T-max 99.0.  Has been on ibuprofen and Tylenol every 4-6 hours.  NyQuil last night and this helped. No shortness of breath, no chest pain, no nausea or vomiting or diarrhea. No home COVID testing done yet. 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  She did get flu vaccine 01/31/21. She has had all recommended COVID vaccines as well.  ROS: See pertinent positives and negatives per HPI.  Past Medical History:  Diagnosis Date   Allergy    Chicken pox    GERD (gastroesophageal reflux disease)    Gestational diabetes    History of UTI    Hypothyroidism    Nephrolithiasis 1999    Past Surgical History:  Procedure Laterality Date   CESAREAN SECTION  1986   EXCISION VAGINAL CYST  1998   LITHOTRIPSY  1999   WISDOM TOOTH EXTRACTION       Current Outpatient Medications:    fluticasone (FLONASE) 50 MCG/ACT nasal spray, Place into both nostrils daily., Disp: , Rfl:    hydrochlorothiazide (HYDRODIURIL) 25 MG tablet, Take 0.5 tablets (12.5 mg total) by mouth daily., Disp: 45 tablet, Rfl: 1   levothyroxine (SYNTHROID) 75 MCG tablet, Take 1 tablet (75 mcg total) by mouth daily before breakfast., Disp: 90  tablet, Rfl: 3   loratadine (CLARITIN) 10 MG tablet, Take 10 mg by mouth daily., Disp: , Rfl:    oseltamivir (TAMIFLU) 75 MG capsule, Take 1 capsule (75 mg total) by mouth 2 (two) times daily., Disp: 10 capsule, Rfl: 0  EXAM:  VITALS per patient if applicable:  Vitals with BMI 04/07/2021 10/22/2020 03/06/2020  Height 5\' 2"  5\' 2"  5\' 2"   Weight 179 lbs 187 lbs 183 lbs 10 oz  BMI 32.73 34.19 33.57  Systolic - 136 130  Diastolic - 82 75  Pulse - 65 66     GENERAL: alert, oriented, appears well and in no acute distress  HEENT: atraumatic, conjunttiva clear, no obvious abnormalities on inspection of external nose and ears  NECK: normal movements of the head and neck  LUNGS: on inspection no signs of respiratory distress, breathing rate appears normal, no obvious gross SOB, gasping or wheezing  CV: no obvious cyanosis  MS: moves all visible extremities without noticeable abnormality  PSYCH/NEURO: pleasant and cooperative, no obvious depression or anxiety, speech and thought processing grossly intact  LABS: none today   Chemistry      Component Value Date/Time   NA 138 10/22/2020 0842   NA 140 09/06/2017 0000   K 4.0 10/22/2020 0842   CL 101 10/22/2020 0842   CO2 28 10/22/2020 0842   BUN 14 10/22/2020 0842   BUN 10 09/06/2017 0000   CREATININE 0.78 10/22/2020 0842      Component Value Date/Time  CALCIUM 9.4 10/22/2020 0842   ALKPHOS 93 10/22/2020 0842   AST 18 10/22/2020 0842   ALT 25 10/22/2020 0842   BILITOT 0.6 10/22/2020 0842     Lab Results  Component Value Date   HGBA1C 6.2 10/22/2020   ASSESSMENT AND PLAN:  Discussed the following assessment and plan:  Influenza like illness, multiple contacts with documented influenza.  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 Tamiflu 75 mg twice a day for 5 days prescribed today.  Continue symptomatic care as she is already doing.  Quarantine precautions discussed. She will check a home  COVID test today and call back if positive.  Signs/symptoms to call or return for were reviewed and pt expressed understanding.  I discussed the assessment and treatment plan with the patient. The patient was provided an opportunity to ask questions and all were answered. The patient agreed with the plan and demonstrated an understanding of the instructions.   F/u: if not improving in 4-5d  Signed:  Santiago Bumpers, MD           04/07/2021

## 2021-05-06 ENCOUNTER — Other Ambulatory Visit: Payer: Self-pay | Admitting: Family Medicine

## 2021-05-29 ENCOUNTER — Other Ambulatory Visit: Payer: Self-pay | Admitting: Family Medicine

## 2021-06-15 ENCOUNTER — Other Ambulatory Visit: Payer: Self-pay | Admitting: Family Medicine

## 2021-06-15 DIAGNOSIS — Z1231 Encounter for screening mammogram for malignant neoplasm of breast: Secondary | ICD-10-CM

## 2021-06-25 ENCOUNTER — Telehealth: Payer: Self-pay | Admitting: Family Medicine

## 2021-06-25 MED ORDER — HYDROCHLOROTHIAZIDE 25 MG PO TABS: 12.5000 mg | ORAL_TABLET | Freq: Every day | ORAL | 0 refills | Status: DC

## 2021-06-25 NOTE — Telephone Encounter (Signed)
Spoke with pt and advised f/u appt needed. She misunderstood and thought she only needed 1 year cpe (10/2021). Appt scheduled for Monday 2/13 at 2pm

## 2021-06-25 NOTE — Telephone Encounter (Signed)
Med refill for blood pressure medicine  hydrochlorothiazide hydrochlorothiazide (HYDRODIURIL) 25 MG tablet   [id number] - [place], [place] - [address] Phone:  [phone]  Fax:  [phone]

## 2021-06-28 ENCOUNTER — Other Ambulatory Visit: Payer: Self-pay

## 2021-06-28 ENCOUNTER — Ambulatory Visit: Payer: BC Managed Care – PPO | Admitting: Family Medicine

## 2021-06-28 ENCOUNTER — Encounter: Payer: Self-pay | Admitting: Family Medicine

## 2021-06-28 VITALS — BP 136/71 | HR 70 | Temp 98.2°F | Ht 62.0 in | Wt 187.0 lb

## 2021-06-28 DIAGNOSIS — G4733 Obstructive sleep apnea (adult) (pediatric): Secondary | ICD-10-CM

## 2021-06-28 DIAGNOSIS — E669 Obesity, unspecified: Secondary | ICD-10-CM | POA: Diagnosis not present

## 2021-06-28 DIAGNOSIS — I10 Essential (primary) hypertension: Secondary | ICD-10-CM

## 2021-06-28 DIAGNOSIS — E039 Hypothyroidism, unspecified: Secondary | ICD-10-CM

## 2021-06-28 DIAGNOSIS — E034 Atrophy of thyroid (acquired): Secondary | ICD-10-CM

## 2021-06-28 DIAGNOSIS — R7309 Other abnormal glucose: Secondary | ICD-10-CM

## 2021-06-28 LAB — POCT GLYCOSYLATED HEMOGLOBIN (HGB A1C)
HbA1c POC (<> result, manual entry): 5.8 % (ref 4.0–5.6)
HbA1c, POC (controlled diabetic range): 5.8 % (ref 0.0–7.0)
HbA1c, POC (prediabetic range): 5.8 % (ref 5.7–6.4)
Hemoglobin A1C: 5.8 % — AB (ref 4.0–5.6)

## 2021-06-28 MED ORDER — LEVOTHYROXINE SODIUM 75 MCG PO TABS: 75.0000 ug | ORAL_TABLET | Freq: Every day | ORAL | 3 refills | Status: DC

## 2021-06-28 MED ORDER — HYDROCHLOROTHIAZIDE 25 MG PO TABS
12.5000 mg | ORAL_TABLET | Freq: Every day | ORAL | 3 refills | Status: DC
Start: 2021-06-28 — End: 2021-12-13

## 2021-06-28 NOTE — Progress Notes (Signed)
This visit occurred during the SARS-CoV-2 public health emergency.  Safety protocols were in place, including screening questions prior to the visit, additional usage of staff PPE, and extensive cleaning of exam room while observing appropriate contact time as indicated for disinfecting solutions.    Patient ID: Kristy Allen, female  DOB: Jan 10, 1961, [age] y.o.   MRN: [id number] Patient Care Team    Relationship Specialty Notifications Start End  Natalia LeatherwoodKuneff, Jelani Vreeland A, DO PCP - General Family Medicine  06/03/19   Kerri Percheshurmond, Rhonda S, MD Attending Physician Optometry  06/03/19   Carman ChingEdwards, James, MD (Inactive) Consulting Physician Gastroenterology  06/03/19     Chief Complaint  Patient presents with   Hypertension    CMC; pt is not fasting    Subjective: Kristy HandlerLynn Regas is a 61 y.o.  Female  [facility] All past medical history, surgical history, allergies, family history, immunizations, medications and social history were updated in the electronic medical record today. All recent labs, ED visits and hospitalizations within the last year were reviewed.  Hypothyroidism: doing well on levo 75 mcg qd. Compliant with med. Labs UTD 10/2020. Prior note: Patient reports she has a history of hypothyroidism going back as long as approximately 16-18 years.  Her [place] secondary to under replacement with a tsh of 10 and symptoms of fatigue. Thyroid repeat on levo 112 resulted with over supplement. Dose was lowered back to 100 (which was original dose) and TSH was even more over supplemented x2. Pt reports she feels well. No palpitations or fatigue and actually feels great. She has stopped her multivitamin which was new and had biotin and she has stopped drinking coffee in the morning with her thyroid med.     Essential hypertension/obesity Pt reports compliance  with HCTZ 1/2 tab (12.5 mg). Blood pressures ranges at home.Patient denies chest pain, shortness of breath, dizziness or lower  extremity edema.   RF: Obesity, family history of heart disease   Witnessed apneic spells: She has started using the oral device through dentistry and states it is going well.  Prior note.  Patient reports her husband has noted she had apneic spells during her sleep.  She has been worked up recently for fatigue and found to have an elevated TSH.  Her medication regimen was altered, in which she feels like she has more energy and less fatigued but is concerned she may have sleep apnea.    [facility] 04/07/2021 10/22/2020 08/13/2019 06/03/2019  Decreased Interest 0 0 0 0  Down, Depressed, Hopeless 0 0 0 0  PHQ - 2 Score 0 0 0 0   No flowsheet data found.  Immunization History  Administered Date(s) Administered   Influenza,inj,Quad PF,6+ Mos 01/21/2019, 02/14/2019, 01/31/2021   Influenza-Unspecified 02/04/2020   PFIZER Comirnaty(Gray Top)Covid-19 Tri-Sucrose Vaccine 09/26/2020   PFIZER(Purple Top)SARS-COV-2 Vaccination 07/13/2019, 07/30/2019, 02/07/2020   Pfizer Covid-19 Vaccine Bivalent Booster 731yrs & up 01/31/2021   Tdap 05/16/2017   Typhoid Live 09/01/2020   Yellow Fever 09/01/2020   Zoster Recombinat (Shingrix) 01/21/2019, 03/23/2019    Past Medical History:  Diagnosis Date   Allergy    Chicken pox    GERD (gastroesophageal reflux disease)    Gestational diabetes    History of UTI    Hypothyroidism    Nephrolithiasis 1999   Allergies  Allergen Reactions   Sulfa Antibiotics Rash   Past Surgical History:  Procedure Laterality Date   CESAREAN SECTION  1986   EXCISION VAGINAL CYST  1998   LITHOTRIPSY  1999   WISDOM TOOTH EXTRACTION     Family History  Problem Relation Age of Onset   Pulmonary fibrosis Mother        Mother also had a benign brain tumor (2000)   Hypertension Mother    Hypertension Father    Hypothyroidism Father    AAA (abdominal aortic aneurysm) Father    Diabetes Sister    Anxiety disorder Sister    Hypertension Sister     Miscarriages / Stillbirths Sister    Asthma Daughter    Asthma Son    Hypertension Son    Aortic aneurysm Son        With repair 4 years ago.   Heart attack Maternal Grandmother    Hypertension Maternal Grandmother    Hypertension Maternal Grandfather    Heart attack Maternal Grandfather    Diabetes Maternal Grandfather    Hypertension Paternal Grandmother    Heart attack Paternal Grandmother    Hypertension Paternal Grandfather    Social History   Social History Narrative   Marital status/children/pets: Married.  3 children.   Education/employment: 16+ years of education.  Employed as a Ecologist.   Safety:      -smoke alarm in the home:Yes     - wears seatbelt: Yes     - Feels safe in their relationships: Yes    Allergies as of 06/28/2021       Reactions   Sulfa Antibiotics Rash        Medication List        Accurate as of June 28, 2021  2:28 PM. If you have any questions, ask your nurse or doctor.          STOP taking these medications    oseltamivir 75 MG capsule Commonly known as: Tamiflu Stopped by: Felix Pacini, DO       TAKE these medications    fluticasone 50 MCG/ACT nasal spray Commonly known as: FLONASE Place into both nostrils daily.   hydrochlorothiazide 25 MG tablet Commonly known as: HYDRODIURIL Take 0.5 tablets (12.5 mg total) by mouth daily.   levothyroxine 75 MCG tablet Commonly known as: SYNTHROID Take 1 tablet (75 mcg total) by mouth daily before breakfast.   loratadine 10 MG tablet Commonly known as: CLARITIN Take 10 mg by mouth daily.        All past medical history, surgical history, allergies, family history, immunizations andmedications were updated in the EMR today and reviewed under the history and medication portions of their EMR.     Recent Results (from the past 2160 hour(s))  POCT HgB A1C     Status: Abnormal   Collection Time: 06/28/21  2:23 PM  Result Value Ref Range   Hemoglobin  A1C 5.8 (A) 4.0 - 5.6 %   HbA1c POC (<> result, manual entry) 5.8 4.0 - 5.6 %   HbA1c, POC (prediabetic range) 5.8 5.7 - 6.4 %   HbA1c, POC (controlled diabetic range) 5.8 0.0 - 7.0 %    US THYROID  Result Date: 11/14/2019 CLINICAL DATA:  Elevated TSH. EXAM: THYROID ULTRASOUND TECHNIQUE: Ultrasound examination of the thyroid gland and adjacent soft tissues was performed. COMPARISON:  None. FINDINGS: Parenchymal Echotexture: Normal Isthmus: 0.3 cm Right lobe: 2.5 x 1.3 x 1.2 cm Left lobe: 2.8 x 0.9 x 1 cm _________________________________________________________ Estimated total number of nodules >/= 1 cm: 0 Number of spongiform nodules >/=  2 cm not described below (TR1): 0 Number of mixed  cystic and solid nodules >/= 1.5 cm not described below (TR2): 0 _________________________________________________________ No discrete nodules are seen within the thyroid gland. IMPRESSION: Small thyroid gland without evidence for distinct thyroid nodule. The above is in keeping with the ACR TI-RADS recommendations - J Am Coll Radiol 2017;14:587-595. Electronically Signed   By: Katherine Mantle M.D.   On: 11/14/2019 16:53     ROS: 14 pt review of systems performed and negative (unless mentioned in an HPI)  Objective: BP 136/71    Pulse 70    Temp 98.2 F (36.8 C) (Oral)    Ht 5\' 2"  (1.575 m)    Wt 187 lb (84.8 kg)    SpO2 99%    BMI 34.20 kg/m  Physical Exam Vitals and nursing note reviewed.  Constitutional:      General: She is not in acute distress.    Appearance: Normal appearance. She is obese. She is not ill-appearing, toxic-appearing or diaphoretic.  HENT:     Head: Normocephalic and atraumatic.  Eyes:     General: No scleral icterus.       Right eye: No discharge.        Left eye: No discharge.     Extraocular Movements: Extraocular movements intact.     Conjunctiva/sclera: Conjunctivae normal.     Pupils: Pupils are equal, round, and reactive to light.  Cardiovascular:     Rate and Rhythm:  Normal rate and regular rhythm.  Pulmonary:     Effort: Pulmonary effort is normal. No respiratory distress.     Breath sounds: Normal breath sounds. No wheezing, rhonchi or rales.  Musculoskeletal:     Cervical back: Neck supple. No tenderness.  Lymphadenopathy:     Cervical: No cervical adenopathy.  Skin:    General: Skin is warm and dry.     Coloration: Skin is not jaundiced or pale.     Findings: No erythema or rash.  Neurological:     Mental Status: She is alert and oriented to person, place, and time. Mental status is at baseline.     Motor: No weakness.     Gait: Gait normal.  Psychiatric:        Mood and Affect: Mood normal.        Behavior: Behavior normal.        Thought Content: Thought content normal.        Judgment: Judgment normal.    No results found.  Assessment/plan: Idalie Canto is a 61 y.o. female present for [facility] Acquired hypothyroidism Stable  - ultrasound of thyroid normal.  - continue levo 75 mcg mcg- refills after labs - lab due next visit.    Essential hypertension/obesity Stable.  Continue   HCTZ 12.5 mg daily in the morning. -Low-sodium diet -continue exercise regimen Follow-up 5.5 months for cpe (as long as so well controlled could f/u yearly after cpe)  Obstructive sleep apnea Continue to follow with dentistry   Elevated a1c: 6.2 >5.8 today- much better.   Return in about 24 weeks (around 12/13/2021) for CPE (30 min), CMC (30 min).   Orders Placed This Encounter  Procedures   POCT HgB A1C   Meds ordered this encounter  Medications   hydrochlorothiazide (HYDRODIURIL) 25 MG tablet    Sig: Take 0.5 tablets (12.5 mg total) by mouth daily.    Dispense:  45 tablet    Refill:  3   levothyroxine (SYNTHROID) 75 MCG tablet    Sig: Take 1 tablet (75 mcg total) by mouth daily  before breakfast.    Dispense:  90 tablet    Refill:  3   Referral Orders  No referral(s) requested today     Electronically signed by: Felix Pacini,  DO [facility] [place]

## 2021-08-24 ENCOUNTER — Ambulatory Visit
Admission: RE | Admit: 2021-08-24 | Discharge: 2021-08-24 | Disposition: A | Discharge status: Home / Self Care | Payer: BC Managed Care – PPO | Source: Ambulatory Visit

## 2021-08-24 DIAGNOSIS — Z1231 Encounter for screening mammogram for malignant neoplasm of breast: Secondary | ICD-10-CM

## 2021-12-13 ENCOUNTER — Encounter: Payer: Self-pay | Admitting: Family Medicine

## 2021-12-13 ENCOUNTER — Ambulatory Visit (INDEPENDENT_AMBULATORY_CARE_PROVIDER_SITE_OTHER): Payer: BC Managed Care – PPO | Admitting: Family Medicine

## 2021-12-13 VITALS — BP 131/74 | HR 65 | Temp 97.4°F | Ht 61.0 in | Wt 187.0 lb

## 2021-12-13 DIAGNOSIS — Z0000 Encounter for general adult medical examination without abnormal findings: Secondary | ICD-10-CM

## 2021-12-13 DIAGNOSIS — Z0001 Encounter for general adult medical examination with abnormal findings: Secondary | ICD-10-CM

## 2021-12-13 DIAGNOSIS — E034 Atrophy of thyroid (acquired): Secondary | ICD-10-CM | POA: Diagnosis not present

## 2021-12-13 DIAGNOSIS — I10 Essential (primary) hypertension: Secondary | ICD-10-CM | POA: Diagnosis not present

## 2021-12-13 DIAGNOSIS — E2839 Other primary ovarian failure: Secondary | ICD-10-CM

## 2021-12-13 DIAGNOSIS — Z5320 Procedure and treatment not carried out because of patient's decision for unspecified reasons: Secondary | ICD-10-CM | POA: Diagnosis not present

## 2021-12-13 DIAGNOSIS — Z1231 Encounter for screening mammogram for malignant neoplasm of breast: Secondary | ICD-10-CM | POA: Diagnosis not present

## 2021-12-13 DIAGNOSIS — R7309 Other abnormal glucose: Secondary | ICD-10-CM | POA: Diagnosis not present

## 2021-12-13 LAB — CBC
HCT: 40.3 % (ref 36.0–46.0)
Hemoglobin: 13.5 g/dL (ref 12.0–15.0)
MCHC: 33.6 g/dL (ref 30.0–36.0)
MCV: 84.9 fl (ref 78.0–100.0)
Platelets: 374 10*3/uL (ref 150.0–400.0)
RBC: 4.75 Mil/uL (ref 3.87–5.11)
RDW: 15.2 % (ref 11.5–15.5)
WBC: 6.3 10*3/uL (ref 4.0–10.5)

## 2021-12-13 LAB — TSH: TSH: 2.67 u[IU]/mL (ref 0.35–5.50)

## 2021-12-13 LAB — HEMOGLOBIN A1C: Hgb A1c MFr Bld: 6.2 % (ref 4.6–6.5)

## 2021-12-13 MED ORDER — HYDROCHLOROTHIAZIDE 25 MG PO TABS: 12.5000 mg | ORAL_TABLET | Freq: Every day | ORAL | 3 refills | Status: DC

## 2021-12-13 NOTE — Progress Notes (Signed)
Patient ID: Kristy Allen, female  DOB: 30-Jan-1961, 61 y.o.   MRN: [id number] Patient Care Team    Relationship Specialty Notifications Start End  Natalia Leatherwood, DO PCP - General Family Medicine  06/03/19   Kerri Perches, MD Attending Physician Optometry  06/03/19   Carman Ching, MD (Inactive) Consulting Physician Gastroenterology  06/03/19     Chief Complaint  Patient presents with   Annual Exam    Pt is not fasting; cmc    Subjective: Kristy Allen is a 61 y.o.  Female  present for CPE/CMC All past medical history, surgical history, allergies, family history, immunizations, medications and social history were updated in the electronic medical record today. All recent labs, ED visits and hospitalizations within the last year were reviewed.  Health maintenance:  Colonoscopy: completed 7/ 2019 per pt, by [facility]> requested records. Pt reports normal.  Mammogram: completed: 4/11/20231, [facility].  ordered for next year Cervical cancer screening: last pap: 4/2019Neg w/ neg hpv 5 yr. By prior pcp.due 2024 Immunizations: tdap UTD 05/2017, Influenza UTD(encouraged yearly), shingrix series completed, covid series completed Infectious disease screening: HIV declined, Hep C completed  DEXA: estrogen def> ordered today Assistive device: none Oxygen WCB:[employer] Patient has a Dental home. Hospitalizations/ED visits: reviewed  Hypothyroidism: doing well on levo 75 mcg qd.  Compliant with med.  Prior note: Patient reports she has a history of hypothyroidism going back as long as approximately 16-18 years.  Her dose was changed in [place] secondary to under replacement with a tsh of 10 and symptoms of fatigue. Thyroid repeat on levo 112 resulted with over supplement. Dose was lowered back to 100 (which was original dose) and TSH was even more over supplemented x2. Pt reports she feels well. No palpitations or fatigue and actually feels great. She has stopped her multivitamin which was new and had  biotin and she has stopped drinking coffee in the morning with her thyroid med.     Essential hypertension/obesity Pt reports compliance with HCTZ 1/2 tab (12.5 mg). Patient denies chest pain, shortness of breath, dizziness or lower extremity edema.   RF: Obesity, family history of heart disease   OSA: She has started using the oral device through dentistry and states it is going well.  Prior note.  Patient reports her husband has noted she had apneic spells during her sleep.  She has been worked up recently for fatigue and found to have an elevated TSH.  Her medication regimen was altered, in which she feels like she has more energy and less fatigued but is concerned she may have sleep apnea.         12/13/2021    9:34 AM 04/07/2021   10:58 AM 10/22/2020    8:09 AM 08/13/2019    8:58 AM 06/03/2019    2:13 PM  Depression screen PHQ 2/9  Decreased Interest 0 0 0 0 0  Down, Depressed, Hopeless 0 0 0 0 0  PHQ - 2 Score 0 0 0 0 0       No data to display                 Immunization History  Administered Date(s) Administered   Influenza,inj,Quad PF,6+ Mos 01/21/2019, 02/14/2019, 01/31/2021   Influenza-Unspecified 02/04/2020   PFIZER Comirnaty(Gray Top)Covid-19 Tri-Sucrose Vaccine 09/26/2020   PFIZER(Purple Top)SARS-COV-2 Vaccination 07/13/2019, 07/30/2019, 02/07/2020   Pfizer Covid-19 Vaccine Bivalent Booster 94yrs & up 01/31/2021   Tdap 05/16/2017   Typhoid Live 09/01/2020   Yellow Fever  09/01/2020   Zoster Recombinat (Shingrix) 01/21/2019, 03/23/2019     Past Medical History:  Diagnosis Date   Allergy    Chicken pox    GERD (gastroesophageal reflux disease)    Gestational diabetes    History of UTI    Hypothyroidism    Nephrolithiasis 1999   Allergies  Allergen Reactions   Sulfa Antibiotics Rash   Past Surgical History:  Procedure Laterality Date   CESAREAN SECTION  1986   EXCISION VAGINAL CYST  1998   LITHOTRIPSY  1999   WISDOM TOOTH EXTRACTION      Family History  Problem Relation Age of Onset   Pulmonary fibrosis Mother        Mother also had a benign brain tumor (2000)   Hypertension Mother    Hypertension Father    Hypothyroidism Father    AAA (abdominal aortic aneurysm) Father    Diabetes Sister    Anxiety disorder Sister    Hypertension Sister    Miscarriages / Stillbirths Sister    Asthma Daughter    Asthma Son    Hypertension Son    Aortic aneurysm Son        With repair 4 years ago.   Heart attack Maternal Grandmother    Hypertension Maternal Grandmother    Hypertension Maternal Grandfather    Heart attack Maternal Grandfather    Diabetes Maternal Grandfather    Hypertension Paternal Grandmother    Heart attack Paternal Grandmother    Hypertension Paternal Grandfather    Social History   Social History Narrative   Marital status/children/pets: Married.  3 children.   Education/employment: 16+ years of education.  Employed as a Ecologist.   Safety:      -smoke alarm in the home:Yes     - wears seatbelt: Yes     - Feels safe in their relationships: Yes    Allergies as of 12/13/2021       Reactions   Sulfa Antibiotics Rash        Medication List        Accurate as of December 13, 2021  9:49 AM. If you have any questions, ask your nurse or doctor.          fluticasone 50 MCG/ACT nasal spray Commonly known as: FLONASE Place into both nostrils daily.   hydrochlorothiazide 25 MG tablet Commonly known as: HYDRODIURIL Take 0.5 tablets (12.5 mg total) by mouth daily.   levothyroxine 75 MCG tablet Commonly known as: SYNTHROID Take 1 tablet (75 mcg total) by mouth daily before breakfast.   loratadine 10 MG tablet Commonly known as: CLARITIN Take 10 mg by mouth daily.        All past medical history, surgical history, allergies, family history, immunizations andmedications were updated in the EMR today and reviewed under the history and medication portions of their EMR.      No results found for this or any previous visit (from the past 2160 hour(s)).  MM 3D SCREEN BREAST BILATERAL  Result Date: 08/24/2021 CLINICAL DATA:  Screening. EXAM: DIGITAL SCREENING BILATERAL MAMMOGRAM WITH TOMOSYNTHESIS AND CAD TECHNIQUE: Bilateral screening digital craniocaudal and mediolateral oblique mammograms were obtained. Bilateral screening digital breast tomosynthesis was performed. The images were evaluated with computer-aided detection. COMPARISON:  Previous exam(s). ACR Breast Density Category b: There are scattered areas of fibroglandular density. FINDINGS: There are no findings suspicious for malignancy. IMPRESSION: No mammographic evidence of malignancy. A result letter of this screening mammogram will be mailed directly to the  patient. RECOMMENDATION: Screening mammogram in one year. (Code:SM-B-01Y) BI-RADS CATEGORY  1: Negative. Electronically Signed   By: Fidela Salisbury M.D.   On: 08/24/2021 17:26     ROS 14 pt review of systems performed and negative (unless mentioned in an HPI)  Objective: BP 131/74   Pulse 65   Temp (!) 97.4 F (36.3 C) (Oral)   Ht 5\' 1"  (1.549 m)   Wt 187 lb (84.8 kg)   SpO2 99%   BMI 35.33 kg/m  Physical Exam Vitals and nursing note reviewed.  Constitutional:      General: She is not in acute distress.    Appearance: Normal appearance. She is not ill-appearing or toxic-appearing.  HENT:     Head: Normocephalic and atraumatic.     Right Ear: Tympanic membrane, ear canal and external ear normal. There is no impacted cerumen.     Left Ear: Tympanic membrane, ear canal and external ear normal. There is no impacted cerumen.     Nose: No congestion or rhinorrhea.     Mouth/Throat:     Mouth: Mucous membranes are moist.     Pharynx: Oropharynx is clear. No oropharyngeal exudate or posterior oropharyngeal erythema.  Eyes:     General: No scleral icterus.       Right eye: No discharge.        Left eye: No discharge.     Extraocular  Movements: Extraocular movements intact.     Conjunctiva/sclera: Conjunctivae normal.     Pupils: Pupils are equal, round, and reactive to light.  Cardiovascular:     Rate and Rhythm: Normal rate and regular rhythm.     Pulses: Normal pulses.     Heart sounds: Normal heart sounds. No murmur heard.    No friction rub. No gallop.  Pulmonary:     Effort: Pulmonary effort is normal. No respiratory distress.     Breath sounds: Normal breath sounds. No stridor. No wheezing, rhonchi or rales.  Chest:     Chest wall: No tenderness.  Abdominal:     General: Abdomen is flat. Bowel sounds are normal. There is no distension.     Palpations: Abdomen is soft. There is no mass.     Tenderness: There is no abdominal tenderness. There is no right CVA tenderness, left CVA tenderness, guarding or rebound.     Hernia: No hernia is present.  Musculoskeletal:        General: No swelling, tenderness or deformity. Normal range of motion.     Cervical back: Normal range of motion and neck supple. No rigidity or tenderness.     Right lower leg: No edema.     Left lower leg: No edema.  Lymphadenopathy:     Cervical: No cervical adenopathy.  Skin:    General: Skin is warm and dry.     Coloration: Skin is not jaundiced or pale.     Findings: No bruising, erythema, lesion or rash.  Neurological:     General: No focal deficit present.     Mental Status: She is alert and oriented to person, place, and time. Mental status is at baseline.     Cranial Nerves: No cranial nerve deficit.     Sensory: No sensory deficit.     Motor: No weakness.     Coordination: Coordination normal.     Gait: Gait normal.     Deep Tendon Reflexes: Reflexes normal.  Psychiatric:        Mood and Affect: Mood normal.  Behavior: Behavior normal.        Thought Content: Thought content normal.        Judgment: Judgment normal.     No results found.  Assessment/plan: Kristy Allen is a 61 y.o. female present for  CPE/cmc Acquired hypothyroidism Stable - ultrasound of thyroid normal.  Continue levo 75 mcg mcg- refills after labs TSH, T4 free collected today   Essential hypertension/obesity Stable Continue   HCTZ 12.5 mg daily in the morning. -Low-sodium diet -continue exercise regimen CBC, CMP, TSH and lipids collected today  Obstructive sleep apnea Continue to follow with dentistry. Dental appliance is working well  Elevated a1c: 6.2 >5.8> A1c collected today  Estrogen deficiency - DG Bone Density; Future HIV screening declined Breast cancer screening by mammogram - MM 3D SCREEN BREAST BILATERAL; Future Elevated hemoglobin A1c - Hemoglobin A1c Routine general medical examination at a health care facility Colonoscopy: completed 7/ 2019 per pt, by [facility]> requested records. Pt reports normal.  Mammogram: completed: 4/11/20231, [facility].  ordered for next year Cervical cancer screening: last pap: 4/2019Neg w/ neg hpv 5 yr. By prior pcp.due 2024 Immunizations: tdap UTD 05/2017, Influenza UTD(encouraged yearly), shingrix series completed, covid series completed Infectious disease screening: HIV declined, Hep C completed  DEXA: estrogen def> ordered today Patient was encouraged to exercise greater than 150 minutes a week. Patient was encouraged to choose a diet filled with fresh fruits and vegetables, and lean meats. AVS provided to patient today for education/recommendation on gender specific health and safety maintenance.   Return in about 24 weeks (around 05/30/2022) for Routine chronic condition follow-up.   Orders Placed This Encounter  Procedures   MM 3D SCREEN BREAST BILATERAL   DG Bone Density   CBC   Comprehensive metabolic panel   Hemoglobin A1c   Lipid panel   TSH   Meds ordered this encounter  Medications   hydrochlorothiazide (HYDRODIURIL) 25 MG tablet    Sig: Take 0.5 tablets (12.5 mg total) by mouth daily.    Dispense:  45 tablet    Refill:  3   Referral  Orders  No referral(s) requested today     Electronically signed by: Felix Pacini, DO [facility] [place]

## 2021-12-13 NOTE — Patient Instructions (Signed)
Return in about 24 weeks (around 05/30/2022) for Routine chronic condition follow-up.        Great to see you today.  I have refilled the medication(s) we provide.   If labs were collected, we will inform you of lab results once received either by echart message or telephone call.   - echart message- for normal results that have been seen by the patient already.   - telephone call: abnormal results or if patient has not viewed results in their echart.  Health Maintenance, Female Adopting a healthy lifestyle and getting preventive care are important in promoting health and wellness. Ask your health care provider about: The right schedule for you to have regular tests and exams. Things you can do on your own to prevent diseases and keep yourself healthy. What should I know about diet, weight, and exercise? Eat a healthy diet  Eat a diet that includes plenty of vegetables, fruits, low-fat dairy products, and lean protein. Do not eat a lot of foods that are high in solid fats, added sugars, or sodium. Maintain a healthy weight Body mass index (BMI) is used to identify weight problems. It estimates body fat based on height and weight. Your health care provider can help determine your BMI and help you achieve or maintain a healthy weight. Get regular exercise Get regular exercise. This is one of the most important things you can do for your health. Most adults should: Exercise for at least 150 minutes each week. The exercise should increase your heart rate and make you sweat (moderate-intensity exercise). Do strengthening exercises at least twice a week. This is in addition to the moderate-intensity exercise. Spend less time sitting. Even light physical activity can be beneficial. Watch cholesterol and blood lipids Have your blood tested for lipids and cholesterol at 61 years of age, then have this test every 5 years. Have your cholesterol levels checked more often if: Your lipid or  cholesterol levels are high. You are older than 61 years of age. You are at high risk for heart disease. What should I know about cancer screening? Depending on your health history and family history, you may need to have cancer screening at various ages. This may include screening for: Breast cancer. Cervical cancer. Colorectal cancer. Skin cancer. Lung cancer. What should I know about heart disease, diabetes, and high blood pressure? Blood pressure and heart disease High blood pressure causes heart disease and increases the risk of stroke. This is more likely to develop in people who have high blood pressure readings or are overweight. Have your blood pressure checked: Every 3-5 years if you are 37-55 years of age. Every year if you are 77 years old or older. Diabetes Have regular diabetes screenings. This checks your fasting blood sugar level. Have the screening done: Once every three years after age 59 if you are at a normal weight and have a low risk for diabetes. More often and at a younger age if you are overweight or have a high risk for diabetes. What should I know about preventing infection? Hepatitis B If you have a higher risk for hepatitis B, you should be screened for this virus. Talk with your health care provider to find out if you are at risk for hepatitis B infection. Hepatitis C Testing is recommended for: Everyone born from [birth year] through 1965. Anyone with known risk factors for hepatitis C. Sexually transmitted infections (STIs) Get screened for STIs, including gonorrhea and chlamydia, if: You are sexually active and are  younger than 61 years of age. You are older than 61 years of age and your health care provider tells you that you are at risk for this type of infection. Your sexual activity has changed since you were last screened, and you are at increased risk for chlamydia or gonorrhea. Ask your health care provider if you are at risk. Ask your health care  provider about whether you are at high risk for HIV. Your health care provider may recommend a prescription medicine to help prevent HIV infection. If you choose to take medicine to prevent HIV, you should first get tested for HIV. You should then be tested every 3 months for as long as you are taking the medicine. Pregnancy If you are about to stop having your period (premenopausal) and you may become pregnant, seek counseling before you get pregnant. Take 400 to 800 micrograms (mcg) of folic acid every day if you become pregnant. Ask for birth control (contraception) if you want to prevent pregnancy. Osteoporosis and menopause Osteoporosis is a disease in which the bones lose minerals and strength with aging. This can result in bone fractures. If you are 72 years old or older, or if you are at risk for osteoporosis and fractures, ask your health care provider if you should: Be screened for bone loss. Take a calcium or vitamin D supplement to lower your risk of fractures. Be given hormone replacement therapy (HRT) to treat symptoms of menopause. Follow these instructions at home: Alcohol use Do not drink alcohol if: Your health care provider tells you not to drink. You are pregnant, may be pregnant, or are planning to become pregnant. If you drink alcohol: Limit how much you have to: 0-1 drink a day. Know how much alcohol is in your drink. In the [place] one drink equals one 12 oz bottle of beer (355 mL), one 5 oz glass of wine (148 mL), or one 1 oz glass of hard liquor (44 mL). Lifestyle Do not use any products that contain nicotine or tobacco. These products include cigarettes, chewing tobacco, and vaping devices, such as e-cigarettes. If you need help quitting, ask your health care provider. Do not use street drugs. Do not share needles. Ask your health care provider for help if you need support or information about quitting drugs. General instructions Schedule regular health, dental, and  eye exams. Stay current with your vaccines. Tell your health care provider if: You often feel depressed. You have ever been abused or do not feel safe at home. Summary Adopting a healthy lifestyle and getting preventive care are important in promoting health and wellness. Follow your health care provider's instructions about healthy diet, exercising, and getting tested or screened for diseases. Follow your health care provider's instructions on monitoring your cholesterol and blood pressure. This information is not intended to replace advice given to you by your health care provider. Make sure you discuss any questions you have with your health care provider. Document Revised: 09/21/2020 Document Reviewed: 09/21/2020 Elsevier Patient Education  [employer].

## 2021-12-14 ENCOUNTER — Other Ambulatory Visit: Payer: Self-pay | Admitting: Family Medicine

## 2021-12-14 DIAGNOSIS — E039 Hypothyroidism, unspecified: Secondary | ICD-10-CM

## 2021-12-14 LAB — COMPREHENSIVE METABOLIC PANEL
ALT: 34 U/L (ref 0–35)
AST: 22 U/L (ref 0–37)
Albumin: 4.1 g/dL (ref 3.5–5.2)
Alkaline Phosphatase: 79 U/L (ref 39–117)
BUN: 15 mg/dL (ref 6–23)
CO2: 27 mEq/L (ref 19–32)
Calcium: 9.4 mg/dL (ref 8.4–10.5)
Chloride: 104 mEq/L (ref 96–112)
Creatinine, Ser: 0.75 mg/dL (ref 0.40–1.20)
GFR: 86.1 mL/min (ref >60.00)
Glucose, Bld: 100 mg/dL — ABNORMAL HIGH (ref 70–99)
Potassium: 4.3 mEq/L (ref 3.5–5.1)
Sodium: 139 mEq/L (ref 135–145)
Total Bilirubin: 0.4 mg/dL (ref 0.2–1.2)
Total Protein: 6.9 g/dL (ref 6.0–8.3)

## 2021-12-14 LAB — LIPID PANEL
Cholesterol: 211 mg/dL — ABNORMAL HIGH (ref 0–200)
HDL: 58.1 mg/dL (ref >39.00)
LDL Cholesterol: 135 mg/dL — ABNORMAL HIGH (ref 0–99)
NonHDL: 152.42
Total CHOL/HDL Ratio: 4
Triglycerides: 88 mg/dL (ref 0.0–149.0)
VLDL: 17.6 mg/dL (ref 0.0–40.0)

## 2021-12-14 MED ORDER — LEVOTHYROXINE SODIUM 75 MCG PO TABS: 75.0000 ug | ORAL_TABLET | Freq: Every day | ORAL | 4 refills | Status: DC

## 2022-09-23 ENCOUNTER — Other Ambulatory Visit: Payer: Self-pay | Admitting: Family Medicine

## 2022-09-23 DIAGNOSIS — Z1231 Encounter for screening mammogram for malignant neoplasm of breast: Secondary | ICD-10-CM

## 2022-10-24 ENCOUNTER — Encounter: Payer: Self-pay | Admitting: Family Medicine

## 2022-10-24 ENCOUNTER — Ambulatory Visit
Admission: RE | Admit: 2022-10-24 | Discharge: 2022-10-24 | Disposition: A | Discharge status: Home / Self Care | Payer: BC Managed Care – PPO | Source: Ambulatory Visit

## 2022-10-24 DIAGNOSIS — Z1231 Encounter for screening mammogram for malignant neoplasm of breast: Secondary | ICD-10-CM

## 2022-10-28 ENCOUNTER — Other Ambulatory Visit: Payer: Self-pay

## 2022-10-28 DIAGNOSIS — E2839 Other primary ovarian failure: Secondary | ICD-10-CM

## 2022-10-28 NOTE — Telephone Encounter (Signed)
Patient called [facility] to schedule bone density scan.  Their fist available appt is not until November 2024.  They told patient they could not schedule her because the current bone scan order will EXPIRE 12/14/2022.  Please cancel current order and create new order so that [facility] is able to schedule bone scan appt for patient.

## 2022-10-28 NOTE — Telephone Encounter (Signed)
New order placed per pts request

## 2023-02-09 ENCOUNTER — Encounter: Payer: Self-pay | Admitting: Family Medicine

## 2023-02-09 ENCOUNTER — Ambulatory Visit: Payer: BC Managed Care – PPO | Admitting: Family Medicine

## 2023-02-09 VITALS — BP 127/76 | HR 61 | Temp 97.8°F | Wt 181.8 lb

## 2023-02-09 DIAGNOSIS — R7309 Other abnormal glucose: Secondary | ICD-10-CM | POA: Diagnosis not present

## 2023-02-09 DIAGNOSIS — I10 Essential (primary) hypertension: Secondary | ICD-10-CM

## 2023-02-09 DIAGNOSIS — Z23 Encounter for immunization: Secondary | ICD-10-CM | POA: Diagnosis not present

## 2023-02-09 DIAGNOSIS — E038 Other specified hypothyroidism: Secondary | ICD-10-CM | POA: Diagnosis not present

## 2023-02-09 DIAGNOSIS — E063 Autoimmune thyroiditis: Secondary | ICD-10-CM

## 2023-02-09 DIAGNOSIS — G4733 Obstructive sleep apnea (adult) (pediatric): Secondary | ICD-10-CM

## 2023-02-09 LAB — COMPREHENSIVE METABOLIC PANEL
ALT: 29 U/L (ref 0–35)
AST: 21 U/L (ref 0–37)
Albumin: 4.1 g/dL (ref 3.5–5.2)
Alkaline Phosphatase: 87 U/L (ref 39–117)
BUN: 15 mg/dL (ref 6–23)
CO2: 30 mEq/L (ref 19–32)
Calcium: 9.5 mg/dL (ref 8.4–10.5)
Chloride: 101 mEq/L (ref 96–112)
Creatinine, Ser: 0.73 mg/dL (ref 0.40–1.20)
GFR: 88.22 mL/min (ref >60.00)
Glucose, Bld: 97 mg/dL (ref 70–99)
Potassium: 4.2 mEq/L (ref 3.5–5.1)
Sodium: 138 mEq/L (ref 135–145)
Total Bilirubin: 0.5 mg/dL (ref 0.2–1.2)
Total Protein: 6.8 g/dL (ref 6.0–8.3)

## 2023-02-09 LAB — CBC
HCT: 41.8 % (ref 36.0–46.0)
Hemoglobin: 13.7 g/dL (ref 12.0–15.0)
MCHC: 32.7 g/dL (ref 30.0–36.0)
MCV: 86.7 fl (ref 78.0–100.0)
Platelets: 394 10*3/uL (ref 150.0–400.0)
RBC: 4.82 Mil/uL (ref 3.87–5.11)
RDW: 14.9 % (ref 11.5–15.5)
WBC: 6.8 10*3/uL (ref 4.0–10.5)

## 2023-02-09 LAB — LIPID PANEL
Cholesterol: 226 mg/dL — ABNORMAL HIGH (ref 0–200)
HDL: 56.8 mg/dL (ref >39.00)
LDL Cholesterol: 147 mg/dL — ABNORMAL HIGH (ref 0–99)
NonHDL: 169.33
Total CHOL/HDL Ratio: 4
Triglycerides: 110 mg/dL (ref 0.0–149.0)
VLDL: 22 mg/dL (ref 0.0–40.0)

## 2023-02-09 LAB — T4, FREE: Free T4: 0.97 ng/dL (ref 0.60–1.60)

## 2023-02-09 LAB — HEMOGLOBIN A1C: Hgb A1c MFr Bld: 6.1 % (ref 4.6–6.5)

## 2023-02-09 LAB — TSH: TSH: 1.91 u[IU]/mL (ref 0.35–5.50)

## 2023-02-09 MED ORDER — HYDROCHLOROTHIAZIDE 25 MG PO TABS: 12.5000 mg | ORAL_TABLET | Freq: Every day | ORAL | 3 refills | Status: DC

## 2023-02-09 NOTE — Progress Notes (Signed)
Patient ID: Kristy Allen, female  DOB: Jul 28, 1960, 62 y.o.   MRN: [id number] Patient Care Team    Relationship Specialty Notifications Start End  Natalia Leatherwood, DO PCP - General Family Medicine  06/03/19   Kerri Perches, MD Attending Physician Optometry  06/03/19   Carman Ching, MD (Inactive) Consulting Physician Gastroenterology  06/03/19     Chief Complaint  Patient presents with   Hypertension    cmc    Subjective: Kristy Allen is a 62 y.o.  Female  present for Chronic Conditions/illness Management All past medical history, surgical history, allergies, family history, immunizations, medications and social history were updated in the electronic medical record today. All recent labs, ED visits and hospitalizations within the last year were reviewed.  Hypothyroidism: compliant with levo 75 mcg qd.   Prior note: Patient reports she has a history of hypothyroidism going back as long as approximately 16-18 years.  Her dose was changed in [place] secondary to under replacement with a tsh of 10 and symptoms of fatigue. Thyroid repeat on levo 112 resulted with over supplement. Dose was lowered back to 100 (which was original dose) and TSH was even more over supplemented x2. Pt reports she feels well. No palpitations or fatigue and actually feels great. She has stopped her multivitamin which was new and had biotin and she has stopped drinking coffee in the morning with her thyroid med.     Essential hypertension/obesity Pt reports compliance with HCTZ 1/2 tab (12.5 mg). Patient denies chest pain, shortness of breath, dizziness or lower extremity edema.   RF: Obesity, family history of heart disease   OSA: She has started using the oral device through dentistry and states it is going well.  Prior note.  Patient reports her husband has noted she had apneic spells during her sleep.  She has been worked up recently for fatigue and found to have an elevated TSH.  Her medication regimen was  altered, in which she feels like she has more energy and less fatigued but is concerned she may have sleep apnea.       02/09/2023    7:44 AM 12/13/2021    9:34 AM 04/07/2021   10:58 AM 10/22/2020    8:09 AM 08/13/2019    8:58 AM  Depression screen PHQ 2/9  Decreased Interest 0 0 0 0 0  Down, Depressed, Hopeless 0 0 0 0 0  PHQ - 2 Score 0 0 0 0 0       No data to display          Immunization History  Administered Date(s) Administered   Influenza,inj,Quad PF,6+ Mos 01/21/2019, 02/14/2019, 01/31/2021, 04/04/2022   Influenza-Unspecified 02/04/2020   PFIZER Comirnaty(Gray Top)Covid-19 Tri-Sucrose Vaccine 09/26/2020   PFIZER(Purple Top)SARS-COV-2 Vaccination 07/13/2019, 07/30/2019, 02/07/2020   Pfizer Covid-19 Vaccine Bivalent Booster 86yrs & up 01/31/2021   Pfizer(Comirnaty)Fall Seasonal Vaccine 12 years and older 04/04/2022   Tdap 05/16/2017   Typhoid Live 09/01/2020   Yellow Fever 09/01/2020   Zoster Recombinant(Shingrix) 01/21/2019, 03/23/2019    Past Medical History:  Diagnosis Date   Allergy    Chicken pox    GERD (gastroesophageal reflux disease)    Gestational diabetes    History of UTI    Hypothyroidism    Nephrolithiasis 1999   Allergies  Allergen Reactions   Sulfa Antibiotics Rash   Past Surgical History:  Procedure Laterality Date   CESAREAN SECTION  1986   EXCISION VAGINAL CYST  1998   LITHOTRIPSY  1999   WISDOM TOOTH EXTRACTION     Family History  Problem Relation Age of Onset   Pulmonary fibrosis Mother        Mother also had a benign brain tumor (2000)   Hypertension Mother    Hypertension Father    Hypothyroidism Father    AAA (abdominal aortic aneurysm) Father    Diabetes Sister    Anxiety disorder Sister    Hypertension Sister    Miscarriages / Stillbirths Sister    Asthma Daughter    Asthma Son    Hypertension Son    Aortic aneurysm Son        With repair 4 years ago.   Heart attack Maternal Grandmother    Hypertension Maternal  Grandmother    Hypertension Maternal Grandfather    Heart attack Maternal Grandfather    Diabetes Maternal Grandfather    Hypertension Paternal Grandmother    Heart attack Paternal Grandmother    Hypertension Paternal Grandfather    Social History   Social History Narrative   Marital status/children/pets: Married.  3 children.   Education/employment: 16+ years of education.  Employed as a Ecologist.   Safety:      -smoke alarm in the home:Yes     - wears seatbelt: Yes     - Feels safe in their relationships: Yes    Allergies as of 02/09/2023       Reactions   Sulfa Antibiotics Rash        Medication List        Accurate as of February 09, 2023  7:51 AM. If you have any questions, ask your nurse or doctor.          STOP taking these medications    fluticasone 50 MCG/ACT nasal spray Commonly known as: FLONASE Stopped by: Kristy Allen       TAKE these medications    hydrochlorothiazide 25 MG tablet Commonly known as: HYDRODIURIL Take 0.5 tablets (12.5 mg total) by mouth daily.   levothyroxine 75 MCG tablet Commonly known as: SYNTHROID Take 1 tablet (75 mcg total) by mouth daily before breakfast.   loratadine 10 MG tablet Commonly known as: CLARITIN Take 10 mg by mouth daily.        All past medical history, surgical history, allergies, family history, immunizations andmedications were updated in the EMR today and reviewed under the history and medication portions of their EMR.     No results found for this or any previous visit (from the past 2160 hour(s)).   ROS 14 pt review of systems performed and negative (unless mentioned in an HPI)  Objective: BP 127/76   Pulse 61   Temp 97.8 F (36.6 C)   Wt 181 lb 12.8 oz (82.5 kg)   SpO2 98%   BMI 34.35 kg/m  Physical Exam Vitals and nursing note reviewed.  Constitutional:      General: She is not in acute distress.    Appearance: Normal appearance. She is not  ill-appearing, toxic-appearing or diaphoretic.  HENT:     Head: Normocephalic and atraumatic.  Eyes:     General: No scleral icterus.       Right eye: No discharge.        Left eye: No discharge.     Extraocular Movements: Extraocular movements intact.     Conjunctiva/sclera: Conjunctivae normal.     Pupils: Pupils are equal, round, and reactive to light.  Cardiovascular:     Rate and Rhythm: Normal rate  and regular rhythm.     Heart sounds: No murmur heard. Pulmonary:     Effort: Pulmonary effort is normal. No respiratory distress.     Breath sounds: Normal breath sounds. No wheezing, rhonchi or rales.  Musculoskeletal:     Right lower leg: No edema.     Left lower leg: No edema.  Skin:    General: Skin is warm.     Findings: No rash.  Neurological:     Mental Status: She is alert and oriented to person, place, and time. Mental status is at baseline.     Motor: No weakness.     Gait: Gait normal.  Psychiatric:        Mood and Affect: Mood normal.        Behavior: Behavior normal.        Thought Content: Thought content normal.        Judgment: Judgment normal.     No results found.  Assessment/plan: Kristy Allen is a 62 y.o. female present for Chronic Conditions/illness Management Acquired hypothyroidism - ultrasound of thyroid normal.  Continue levo 75 mcg mcg- refills after labs TSH, T4 free collected today   Essential hypertension/obesity Stable  continue HCTZ 12.5 mg daily in the morning. -Low-sodium diet -continue exercise regimen CBC, CMP, lipid collected today  Obstructive sleep apnea Continue to follow with dentistry. Dental appliance is working well  Elevated a1c: 6.2 >5.8> 6.2 >A1c collected today  Return in about 1 year (around 02/09/2024) for cpe (20 min), Routine chronic condition follow-up.   Orders Placed This Encounter  Procedures   CBC   Comp Met (CMET)   TSH   T4, free   Hemoglobin A1c   Lipid panel   Meds ordered this encounter   Medications   hydrochlorothiazide (HYDRODIURIL) 25 MG tablet    Sig: Take 0.5 tablets (12.5 mg total) by mouth daily.    Dispense:  45 tablet    Refill:  3   Referral Orders  No referral(s) requested today     Electronically signed by: Kristy Pacini, DO [facility] [place]

## 2023-02-09 NOTE — Patient Instructions (Addendum)
Return in about 1 year (around 02/09/2024) for cpe (20 min), Routine chronic condition follow-up.        Great to see you today.  I have refilled the medication(s) we provide.   If labs were collected or images ordered, we will inform you of  results once we have received them and reviewed. We will contact you either by echart message, or telephone call.  Please give ample time to the testing facility, and our office to run,  receive and review results. Please do not call inquiring of results, even if you can see them in your chart. We will contact you as soon as we are able. If it has been over 1 week since the test was completed, and you have not yet heard from [place], then please call us.    - echart message- for normal results that have been seen by the patient already.   - telephone call: abnormal results or if patient has not viewed results in their echart.  If a referral to a specialist was entered for you, please call us in 2 weeks if you have not heard from the specialist office to schedule.

## 2023-02-10 ENCOUNTER — Other Ambulatory Visit: Payer: Self-pay | Admitting: Family Medicine

## 2023-02-10 DIAGNOSIS — E039 Hypothyroidism, unspecified: Secondary | ICD-10-CM

## 2023-02-10 MED ORDER — LEVOTHYROXINE SODIUM 75 MCG PO TABS
75.0000 ug | ORAL_TABLET | Freq: Every day | ORAL | 4 refills | Status: DC
Start: 2023-02-10 — End: 2024-04-10

## 2023-06-08 ENCOUNTER — Ambulatory Visit
Admission: RE | Admit: 2023-06-08 | Discharge: 2023-06-08 | Disposition: A | Discharge status: Home / Self Care | Payer: 59 | Source: Ambulatory Visit | Attending: Family Medicine | Admitting: Family Medicine

## 2023-06-08 DIAGNOSIS — E2839 Other primary ovarian failure: Secondary | ICD-10-CM

## 2023-06-09 ENCOUNTER — Encounter: Payer: Self-pay | Admitting: Family Medicine

## 2024-01-31 ENCOUNTER — Other Ambulatory Visit: Payer: Self-pay | Admitting: Family Medicine

## 2024-02-18 ENCOUNTER — Other Ambulatory Visit: Payer: Self-pay | Admitting: Family Medicine

## 2024-02-18 DIAGNOSIS — E039 Hypothyroidism, unspecified: Secondary | ICD-10-CM

## 2024-04-08 ENCOUNTER — Other Ambulatory Visit: Payer: Self-pay | Admitting: Family Medicine

## 2024-04-08 DIAGNOSIS — Z1231 Encounter for screening mammogram for malignant neoplasm of breast: Secondary | ICD-10-CM

## 2024-04-09 ENCOUNTER — Ambulatory Visit (INDEPENDENT_AMBULATORY_CARE_PROVIDER_SITE_OTHER): Admitting: Family Medicine

## 2024-04-09 ENCOUNTER — Encounter: Payer: Self-pay | Admitting: Family Medicine

## 2024-04-09 VITALS — BP 136/78 | HR 66 | Temp 97.9°F | Ht 61.0 in | Wt 192.2 lb

## 2024-04-09 DIAGNOSIS — E039 Hypothyroidism, unspecified: Secondary | ICD-10-CM

## 2024-04-09 DIAGNOSIS — G4733 Obstructive sleep apnea (adult) (pediatric): Secondary | ICD-10-CM

## 2024-04-09 DIAGNOSIS — E669 Obesity, unspecified: Secondary | ICD-10-CM | POA: Diagnosis not present

## 2024-04-09 DIAGNOSIS — I10 Essential (primary) hypertension: Secondary | ICD-10-CM

## 2024-04-09 DIAGNOSIS — R7309 Other abnormal glucose: Secondary | ICD-10-CM

## 2024-04-09 DIAGNOSIS — Z23 Encounter for immunization: Secondary | ICD-10-CM | POA: Diagnosis not present

## 2024-04-09 DIAGNOSIS — Z0000 Encounter for general adult medical examination without abnormal findings: Secondary | ICD-10-CM | POA: Diagnosis not present

## 2024-04-09 LAB — HEMOGLOBIN A1C: Hgb A1c MFr Bld: 5.9 % (ref 4.6–6.5)

## 2024-04-09 LAB — COMPREHENSIVE METABOLIC PANEL WITH GFR
ALT: 19 U/L (ref 0–35)
AST: 18 U/L (ref 0–37)
Albumin: 4.2 g/dL (ref 3.5–5.2)
Alkaline Phosphatase: 85 U/L (ref 39–117)
BUN: 16 mg/dL (ref 6–23)
CO2: 31 meq/L (ref 19–32)
Calcium: 9.3 mg/dL (ref 8.4–10.5)
Chloride: 101 meq/L (ref 96–112)
Creatinine, Ser: 0.76 mg/dL (ref 0.40–1.20)
GFR: 83.38 mL/min (ref >60.00)
Glucose, Bld: 94 mg/dL (ref 70–99)
Potassium: 4.3 meq/L (ref 3.5–5.1)
Sodium: 138 meq/L (ref 135–145)
Total Bilirubin: 0.5 mg/dL (ref 0.2–1.2)
Total Protein: 6.6 g/dL (ref 6.0–8.3)

## 2024-04-09 LAB — CBC
HCT: 40 % (ref 36.0–46.0)
Hemoglobin: 13.3 g/dL (ref 12.0–15.0)
MCHC: 33.3 g/dL (ref 30.0–36.0)
MCV: 85.9 fl (ref 78.0–100.0)
Platelets: 401 K/uL — ABNORMAL HIGH (ref 150.0–400.0)
RBC: 4.65 Mil/uL (ref 3.87–5.11)
RDW: 14.8 % (ref 11.5–15.5)
WBC: 6.3 K/uL (ref 4.0–10.5)

## 2024-04-09 LAB — LIPID PANEL
Cholesterol: 193 mg/dL (ref 0–200)
HDL: 60.1 mg/dL (ref >39.00)
LDL Cholesterol: 117 mg/dL — ABNORMAL HIGH (ref 0–99)
NonHDL: 132.85
Total CHOL/HDL Ratio: 3
Triglycerides: 77 mg/dL (ref 0.0–149.0)
VLDL: 15.4 mg/dL (ref 0.0–40.0)

## 2024-04-09 LAB — T4, FREE: Free T4: 0.97 ng/dL (ref 0.60–1.60)

## 2024-04-09 LAB — TSH: TSH: 2.09 u[IU]/mL (ref 0.35–5.50)

## 2024-04-09 MED ORDER — HYDROCHLOROTHIAZIDE 25 MG PO TABS: 25.0000 mg | ORAL_TABLET | Freq: Every day | ORAL | 2 refills | Status: AC

## 2024-04-09 NOTE — Patient Instructions (Addendum)
 Return in about 24 weeks (around 09/24/2024) for Routine chronic condition follow-up, procedure (40 min) w/ PAP.        Great to see you today.  I have refilled the medication(s) we provide.   If labs were collected or images ordered, we will inform you of  results once we have received them and reviewed. We will contact you either by echart message, or telephone call.  Please give ample time to the testing facility, and our office to run,  receive and review results. Please do not call inquiring of results, even if you can see them in your chart. We will contact you as soon as we are able. If it has been over 1 week since the test was completed, and you have not yet heard from us , then please call us .    - echart message- for normal results that have been seen by the patient already.   - telephone call: abnormal results or if patient has not viewed results in their echart.  If a referral to a specialist was entered for you, please call us  in 2 weeks if you have not heard from the specialist office to schedule.

## 2024-04-09 NOTE — Progress Notes (Signed)
 Patient ID: Kristy Allen, female  DOB: 1960-10-01, 63 y.o.   MRN: [id number] Patient Care Team    Relationship Specialty Notifications Start End  Catherine Charlies LABOR, DO PCP - General Family Medicine  06/03/19   Elma Shona RAMAN, MD Attending Physician Optometry  06/03/19   Celestia Agent, MD (Inactive) Consulting Physician Gastroenterology  06/03/19     Chief Complaint  Patient presents with   Annual Exam    Chronic Conditions/illness Management Pt is fasting.     Subjective: Kristy Allen is a 63 y.o.  Female  present for CPE and chronic Conditions/illness Management All past medical history, surgical history, allergies, family history, immunizations, medications and social history were updated in the electronic medical record today. All recent labs, ED visits and hospitalizations within the last year were reviewed.  Health maintenance:  Colonoscopy: completed 7/ 2019 per pt, by [facility]> rPt reports normal.  Continue to follow-up Mammogram: completed: 10/24/2022, [facility].  Scheduled 05/06/2024 Cervical cancer screening: last pap: 4/2019Neg w/ neg hpv 5 yr. By prior pcp.due 2024 Immunizations: tdap UTD 05/2017, Influenza UTD 2025 (encouraged yearly), Prevnar-completed today, shingrix series completed, covid series completed Infectious disease screening: HIV declined, Hep C completed  DEXA: estrogen def> completed in 06/08/2023-normal (-0.8) Patient has a Dental home. Hospitalizations/ED visits: Reviewed  Hypothyroidism: Compliant with levo 75 mcg qd.   Prior note: Patient reports she has a history of hypothyroidism going back as long as approximately 16-18 years.  Her dose was changed in Janurary secondary to under replacement with a tsh of 10 and symptoms of fatigue. Thyroid  repeat on levo 112 resulted with over supplement. Dose was lowered back to 100 (which was original dose) and TSH was even more over supplemented x2. Pt reports she feels well. No palpitations or fatigue and actually  feels great. She has stopped her multivitamin which was new and had biotin and she has stopped drinking coffee in the morning with her thyroid  med.     Essential hypertension/obesity Pt reports compliance with HCTZ 1/2 tab (12.5 mg). Patient denies chest pain, shortness of breath, dizziness or lower extremity edema.    RF: Obesity, family history of heart disease   OSA: She has started using the oral device through dentistry and states it is going well.  Prior note.  Patient reports her husband has noted she had apneic spells during her sleep.  She has been worked up recently for fatigue and found to have an elevated TSH.  Her medication regimen was altered, in which she feels like she has more energy and less fatigued but is concerned she may have sleep apnea.       04/09/2024    9:35 AM 02/09/2023    7:44 AM 12/13/2021    9:34 AM 04/07/2021   10:58 AM 10/22/2020    8:09 AM  Depression screen PHQ 2/9  Decreased Interest 0 0 0 0 0  Down, Depressed, Hopeless 0 0 0 0 0  PHQ - 2 Score 0 0 0 0 0  Altered sleeping 1      Tired, decreased energy 1      Change in appetite 0      Feeling bad or failure about yourself  0      Trouble concentrating 0      Moving slowly or fidgety/restless 0      Suicidal thoughts 0      PHQ-9 Score 2      Difficult doing work/chores Not difficult at all  No data to display          Immunization History  Administered Date(s) Administered   Influenza, Mdck, Trivalent,PF 6+ MOS(egg free) 02/27/2024   Influenza,inj,Quad PF,6+ Mos 01/21/2019, 02/14/2019, 01/31/2021, 04/04/2022   Influenza-Unspecified 02/04/2020   PFIZER Comirnaty(Gray Top)Covid-19 Tri-Sucrose Vaccine 09/26/2020   PFIZER(Purple Top)SARS-COV-2 Vaccination 07/13/2019, 07/30/2019, 02/07/2020   Pfizer Covid-19 Vaccine Bivalent Booster 48yrs & up 01/31/2021   Pfizer(Comirnaty)Fall Seasonal Vaccine 12 years and older 04/04/2022, 02/27/2024   Tdap 05/16/2017   Typhoid Live 09/01/2020    Yellow Fever 09/01/2020   Zoster Recombinant(Shingrix) 01/21/2019, 03/23/2019    Past Medical History:  Diagnosis Date   Allergy    Chicken pox    GERD (gastroesophageal reflux disease)    Gestational diabetes    History of UTI    Hypothyroidism    Nephrolithiasis 1999   Allergies  Allergen Reactions   Sulfa Antibiotics Rash   Past Surgical History:  Procedure Laterality Date   CESAREAN SECTION  1986   EXCISION VAGINAL CYST  1998   LITHOTRIPSY  1999   WISDOM TOOTH EXTRACTION     Family History  Problem Relation Age of Onset   Pulmonary fibrosis Mother        Mother also had a benign brain tumor (2000)   Hypertension Mother    Hypertension Father    Hypothyroidism Father    AAA (abdominal aortic aneurysm) Father    Diabetes Sister    Anxiety disorder Sister    Hypertension Sister    Miscarriages / Stillbirths Sister    Asthma Daughter    Asthma Son    Hypertension Son    Aortic aneurysm Son        With repair 4 years ago.   Heart attack Maternal Grandmother    Hypertension Maternal Grandmother    Hypertension Maternal Grandfather    Heart attack Maternal Grandfather    Diabetes Maternal Grandfather    Hypertension Paternal Grandmother    Heart attack Paternal Grandmother    Hypertension Paternal Grandfather    Social History   Social History Narrative   Marital status/children/pets: Married.  3 children.   Education/employment: 16+ years of education.  Employed as a ecologist.   Safety:      -smoke alarm in the home:Yes     - wears seatbelt: Yes     - Feels safe in their relationships: Yes    Allergies as of 04/09/2024       Reactions   Sulfa Antibiotics Rash        Medication List        Accurate as of April 09, 2024 10:08 AM. If you have any questions, ask your nurse or doctor.          hydrochlorothiazide  25 MG tablet Commonly known as: HYDRODIURIL  Take 1 tablet (25 mg total) by mouth daily. What changed:  how much to take Changed by: Charlies Bellini   levothyroxine  75 MCG tablet Commonly known as: SYNTHROID  Take 1 tablet (75 mcg total) by mouth daily before breakfast.   loratadine 10 MG tablet Commonly known as: CLARITIN Take 10 mg by mouth daily.        All past medical history, surgical history, allergies, family history, immunizations andmedications were updated in the EMR today and reviewed under the history and medication portions of their EMR.     No results found for this or any previous visit (from the past 2160 hours).   Review of Systems  Constitutional: Negative.  HENT: Negative.    Eyes: Negative.   Respiratory: Negative.    Cardiovascular: Negative.   Gastrointestinal: Negative.   Genitourinary: Negative.   Musculoskeletal: Negative.   Skin: Negative.   Neurological: Negative.   Endo/Heme/Allergies: Negative.   Psychiatric/Behavioral: Negative.    All other systems reviewed and are negative.  14 pt review of systems performed and negative (unless mentioned in an HPI)  Objective: BP 136/78   Pulse 66   Temp 97.9 F (36.6 C)   Ht 5' 1 (1.549 m)   Wt 192 lb 3.2 oz (87.2 kg)   SpO2 98%   BMI 36.32 kg/m  Physical Exam Vitals and nursing note reviewed.  Constitutional:      General: She is not in acute distress.    Appearance: Normal appearance. She is not ill-appearing, toxic-appearing or diaphoretic.  HENT:     Head: Normocephalic and atraumatic.     Right Ear: Tympanic membrane, ear canal and external ear normal. There is no impacted cerumen.     Left Ear: Tympanic membrane, ear canal and external ear normal. There is no impacted cerumen.     Nose: No congestion or rhinorrhea.     Mouth/Throat:     Mouth: Mucous membranes are moist.     Pharynx: Oropharynx is clear. No oropharyngeal exudate or posterior oropharyngeal erythema.  Eyes:     General: No scleral icterus.       Right eye: No discharge.        Left eye: No discharge.     Extraocular  Movements: Extraocular movements intact.     Conjunctiva/sclera: Conjunctivae normal.     Pupils: Pupils are equal, round, and reactive to light.  Cardiovascular:     Rate and Rhythm: Normal rate and regular rhythm.     Pulses: Normal pulses.     Heart sounds: Normal heart sounds. No murmur heard.    No friction rub. No gallop.  Pulmonary:     Effort: Pulmonary effort is normal. No respiratory distress.     Breath sounds: Normal breath sounds. No stridor. No wheezing, rhonchi or rales.  Chest:     Chest wall: No tenderness.  Abdominal:     General: Abdomen is flat. Bowel sounds are normal. There is no distension.     Palpations: Abdomen is soft. There is no mass.     Tenderness: There is no abdominal tenderness. There is no right CVA tenderness, left CVA tenderness, guarding or rebound.     Hernia: No hernia is present.  Musculoskeletal:        General: No swelling, tenderness or deformity. Normal range of motion.     Cervical back: Normal range of motion and neck supple. No rigidity or tenderness.     Right lower leg: No edema.     Left lower leg: No edema.  Lymphadenopathy:     Cervical: No cervical adenopathy.  Skin:    General: Skin is warm and dry.     Coloration: Skin is not jaundiced or pale.     Findings: No bruising, erythema, lesion or rash.  Neurological:     General: No focal deficit present.     Mental Status: She is alert and oriented to person, place, and time. Mental status is at baseline.     Cranial Nerves: No cranial nerve deficit.     Sensory: No sensory deficit.     Motor: No weakness.     Coordination: Coordination normal.     Gait: Gait normal.  Deep Tendon Reflexes: Reflexes normal.  Psychiatric:        Mood and Affect: Mood normal.        Behavior: Behavior normal.        Thought Content: Thought content normal.        Judgment: Judgment normal.     No results found.  Assessment/plan: Kristy Allen is a 63 y.o. female present for CPE and  chronic Conditions/illness Management Acquired hypothyroidism - ultrasound of thyroid  normal.  Continue levo 75 mcg mcg- refills after labs TSH, T4 free collected today   Essential hypertension/obesity Above goal < 130/80 Increase  HCTZ 12.5 > 25 mg daily in the morning. -Low-sodium diet -continue exercise regimen Labs collected today  Obstructive sleep apnea Continue to follow with dentistry. Dental appliance is working well  Elevated a1c: 6.2 >5.8> 6.2 > 6.1 >A1c collected today  Need for vaccination for pneumococcus Administered today  Routine general medical examination at a health care facility (Primary) Colonoscopy: completed 7/ 2019 per pt, by [facility]> rPt reports normal.  Continue to follow-up Mammogram: completed: 10/24/2022, [facility].  Scheduled 05/06/2024 Cervical cancer screening: last pap: 4/2019Neg w/ neg hpv 5 yr. By prior pcp.due 2024 Immunizations: tdap UTD 05/2017, Influenza UTD 2025 (encouraged yearly), Prevnar-completed today, shingrix series completed, covid series completed Infectious disease screening: HIV declined, Hep C completed  DEXA: estrogen def> completed in 06/08/2023-normal (-0.8) Patient was encouraged to exercise greater than 150 minutes a week. Patient was encouraged to choose a diet filled with fresh fruits and vegetables, and lean meats. AVS provided to patient today for education/recommendation on gender specific health and safety maintenance.   Return in about 24 weeks (around 09/24/2024) for Routine chronic condition follow-up, procedure (40 min) w/ PAP.   Orders Placed This Encounter  Procedures   CBC   Comprehensive metabolic panel with GFR   Hemoglobin A1c   Lipid panel   TSH   T4, free   Meds ordered this encounter  Medications   hydrochlorothiazide  (HYDRODIURIL ) 25 MG tablet    Sig: Take 1 tablet (25 mg total) by mouth daily.    Dispense:  90 tablet    Refill:  2   Referral Orders  No referral(s) requested today      Electronically signed by: Charlies Bellini, DO [facility]

## 2024-04-10 ENCOUNTER — Ambulatory Visit: Payer: Self-pay | Admitting: Family Medicine

## 2024-04-10 DIAGNOSIS — E039 Hypothyroidism, unspecified: Secondary | ICD-10-CM

## 2024-04-10 MED ORDER — LEVOTHYROXINE SODIUM 75 MCG PO TABS: 75.0000 ug | ORAL_TABLET | Freq: Every day | ORAL | 4 refills | Status: AC

## 2024-04-23 ENCOUNTER — Encounter: Admitting: Family Medicine

## 2024-04-28 ENCOUNTER — Other Ambulatory Visit: Payer: Self-pay | Admitting: Family Medicine

## 2024-05-06 ENCOUNTER — Inpatient Hospital Stay: Admission: RE | Admit: 2024-05-06 | Discharge: 2024-05-06

## 2024-05-06 DIAGNOSIS — Z1231 Encounter for screening mammogram for malignant neoplasm of breast: Secondary | ICD-10-CM

## 2024-05-13 ENCOUNTER — Ambulatory Visit: Payer: Self-pay | Admitting: Family Medicine
